# Patient Record
Sex: Female | Born: 1990
Health system: Southern US, Community
[De-identification: ages and names within clinical notes are randomized; demographics above are authoritative.]

## PROBLEM LIST (undated history)

## (undated) DIAGNOSIS — F419 Anxiety disorder, unspecified: Secondary | ICD-10-CM

## (undated) DIAGNOSIS — E079 Disorder of thyroid, unspecified: Secondary | ICD-10-CM

## (undated) HISTORY — DX: Disorder of thyroid, unspecified: E07.9

## (undated) HISTORY — DX: Anxiety disorder, unspecified: F41.9

---
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
## 2016-08-26 ENCOUNTER — Encounter: Payer: Self-pay | Admitting: Osteopathic Medicine

## 2016-08-26 ENCOUNTER — Ambulatory Visit (INDEPENDENT_AMBULATORY_CARE_PROVIDER_SITE_OTHER): Payer: 59 | Admitting: Osteopathic Medicine

## 2016-08-26 VITALS — BP 123/71 | HR 59 | Ht 64.0 in | Wt 152.0 lb

## 2016-08-26 DIAGNOSIS — E049 Nontoxic goiter, unspecified: Secondary | ICD-10-CM | POA: Diagnosis not present

## 2016-08-26 DIAGNOSIS — F339 Major depressive disorder, recurrent, unspecified: Secondary | ICD-10-CM | POA: Diagnosis not present

## 2016-08-26 DIAGNOSIS — E559 Vitamin D deficiency, unspecified: Secondary | ICD-10-CM

## 2016-08-26 DIAGNOSIS — F419 Anxiety disorder, unspecified: Secondary | ICD-10-CM

## 2016-08-26 DIAGNOSIS — B360 Pityriasis versicolor: Secondary | ICD-10-CM

## 2016-08-26 MED ORDER — ESCITALOPRAM OXALATE 10 MG PO TABS: 10.0000 mg | ORAL_TABLET | Freq: Every day | ORAL | 1 refills | Status: DC

## 2016-08-26 NOTE — Progress Notes (Signed)
HPI: Lindsey Powell is a 26 y.o. female  who presents to [facility] today, 08/26/16,  for chief complaint of:  Chief Complaint  Patient presents with  . Establish Care    Depression/anxiety: Patient thinks possibly ongoing for most of adolescence-adulthood. Never really addressed before. Never on medications. Patient states that family is not terribly supportive of mental health issues, no known family history that she is aware. Reports decreased interest/pleasure in daily activities, feels fairly motivated at work but in personal life has noticed more fatigued, irritable, mood is a suffering, relationships are becoming a problem for her. He has good support from her boyfriend, she enjoys her work. No history of bipolar type symptoms such as you for as lack of inhibition, risky sexual or spending habits. Family/friends have told her that she should seek medical attention and she finally feels able to do that. Would also like blood testing for thyroid or other possible causes of her symptoms.  Skin issue: Patchy skin on back and spreading onto torso for the past year or so. Recently seen by another physician about this but wasn't totally comfortable with their explanation, had some additional questions  Past medical, surgical, social and family history reviewed: There are no active problems to display for this patient.  No past surgical history on file. Social History  Substance Use Topics  . Smoking status: Not on file  . Smokeless tobacco: Not on file  . Alcohol use Not on file   No family history on file.   Current medication list and allergy/intolerance information reviewed:   No current outpatient prescriptions on file.   No current facility-administered medications for this visit.    Allergies not on file    Review of Systems:  Constitutional:  No  fever, no chills, No recent illness, No unintentional weight changes. No significant fatigue.    HEENT: No  headache, no vision change, no hearing change, No sore throat, No  sinus pressure  Cardiac: No  chest pain, No  pressure, No palpitations, No  Orthopnea  Respiratory:  No  shortness of breath. No  Cough  Gastrointestinal: No  abdominal pain, No  nausea, No  vomiting  Musculoskeletal: No new myalgia/arthralgia  Skin: No  Rash, No other wounds/concerning lesions  Neurologic: No  weakness, No  dizziness  Psychiatric: +concerns with depression, +concerns with anxiety, +sleep problems, No mood problems  Exam:  BP 123/71   Pulse (!) 59   Ht 5\' 4"  (1.626 m)   Wt 152 lb (68.9 kg)   BMI 26.09 kg/m   Constitutional: VS see above. General Appearance: alert, well-developed, well-nourished, NAD  Ears, Nose, Mouth, Throat: MMM, Normal external inspection ears/nares/mouth/lips/gums.   Respiratory: Normal respiratory effort. no wheeze, no rhonchi, no rales  Cardiovascular: S1/S2 normal, no murmur, no rub/gallop auscultated. RRR.   Gastrointestinal: Nontender, no masses. No hepatomegaly  Musculoskeletal: Gait normal. No clubbing/cyanosis of digits.   Neurological: Normal balance/coordination. No tremor.   Skin: warm, dry, intact. No rash/ulcer.  Psychiatric: Normal judgment/insight. Normal mood and affect. Oriented x3.    Depression screen [facility] 2/9 08/26/2016  Decreased Interest 3  Down, Depressed, Hopeless 3  PHQ - 2 Score 6  Altered sleeping 2  Tired, decreased energy 3  Change in appetite 3  Feeling bad or failure about yourself  2  Trouble concentrating 2  Moving slowly or fidgety/restless 0  Suicidal thoughts 0  PHQ-9 Score 18   GAD 7 : Generalized Anxiety Score 08/26/2016  Nervous, Anxious, on Edge 3  Control/stop worrying 3  Worry too much - different things 3  Trouble relaxing 3  Restless 1  Easily annoyed or irritable 3  Afraid - awful might happen 0  Total GAD 7 Score 16  Anxiety Difficulty Very difficult     ASSESSMENT/PLAN:   Anxiety -  Plan: CBC with Differential/Platelet, COMPLETE METABOLIC PANEL WITH GFR, TSH, VITAMIN D 25 Hydroxy (Vit-D Deficiency, Fractures), escitalopram (LEXAPRO) 10 MG tablet  Depression, recurrent (HCC) - Plan: CBC with Differential/Platelet, COMPLETE METABOLIC PANEL WITH GFR, TSH, VITAMIN D 25 Hydroxy (Vit-D Deficiency, Fractures), escitalopram (LEXAPRO) 10 MG tablet  Goiter - Plan: TSH  Tinea versicolor - Plan: CBC with Differential/Platelet, COMPLETE METABOLIC PANEL WITH GFR    Visit summary with medication list and pertinent instructions was printed for patient to review. All questions at time of visit were answered - patient instructed to contact office with any additional concerns. ER/RTC precautions were reviewed with the patient. Follow-up plan: Return in about 4 weeks (around 09/23/2016) for follow-up anxiety and depression .

## 2016-08-26 NOTE — Patient Instructions (Signed)
We are starting a medication today called Lexapro to help treat your anxiety and depression. This is a daily medication to help control your symptoms. I also highly encourage my patients who are suffering from anxiety and/or depression to seek care with a counselor or therapist. A therapist can coach you in techniques to recognize and deal with troubling thought patterns and behaviors. The ability to cope with external stressors is crucial to overall mental health.   Expect a call or message from me in the next 2 weeks: If doing well on the medicine but not feeling any effect, we can increase the dose. If you're starting to feel some effect/improvement, we can hold off on a dose increase and reevaluate at your office visit.   Let's plan to follow up in the office in 4 weeks. At that time, we can talk about how well the medicine is working for you, and we can consider increasing the dose, adding another medicine, etc.   If you experience problematic side effects, please let me know ASAP - we can switch the medicine any time, and we don't need an appointment for this.   Any questions or concerns, call me!

## 2016-08-27 LAB — COMPLETE METABOLIC PANEL WITH GFR
ALT: 8 U/L (ref 6–29)
AST: 17 U/L (ref 10–30)
Albumin: 4.3 g/dL (ref 3.6–5.1)
Alkaline Phosphatase: 54 U/L (ref 33–115)
BILIRUBIN TOTAL: 0.4 mg/dL (ref 0.2–1.2)
BUN: 14 mg/dL (ref 7–25)
CALCIUM: 9.5 mg/dL (ref 8.6–10.2)
CO2: 24 mmol/L (ref 20–31)
CREATININE: 0.83 mg/dL (ref 0.50–1.10)
Chloride: 105 mmol/L (ref 98–110)
GFR, Est Non African American: >89 mL/min (ref >=60)
Glucose, Bld: 80 mg/dL (ref 65–99)
Potassium: 4.2 mmol/L (ref 3.5–5.3)
Sodium: 139 mmol/L (ref 135–146)
TOTAL PROTEIN: 7.3 g/dL (ref 6.1–8.1)

## 2016-08-27 LAB — CBC WITH DIFFERENTIAL/PLATELET
BASOS PCT: 1 %
Basophils Absolute: 79 cells/uL (ref 0–200)
EOS ABS: 158 {cells}/uL (ref 15–500)
Eosinophils Relative: 2 %
HEMATOCRIT: 40.5 % (ref 35.0–45.0)
HEMOGLOBIN: 13.4 g/dL (ref 11.7–15.5)
LYMPHS PCT: 35 %
Lymphs Abs: 2765 cells/uL (ref 850–3900)
MCH: 29.5 pg (ref 27.0–33.0)
MCHC: 33.1 g/dL (ref 32.0–36.0)
MCV: 89 fL (ref 80.0–100.0)
MPV: 10.2 fL (ref 7.5–12.5)
Monocytes Absolute: 711 cells/uL (ref 200–950)
Monocytes Relative: 9 %
NEUTROS ABS: 4187 {cells}/uL (ref 1500–7800)
Neutrophils Relative %: 53 %
Platelets: 220 10*3/uL (ref 140–400)
RBC: 4.55 MIL/uL (ref 3.80–5.10)
RDW: 13.5 % (ref 11.0–15.0)
WBC: 7.9 10*3/uL (ref 3.8–10.8)

## 2016-08-27 LAB — TSH: TSH: 3.67 m[IU]/L

## 2016-08-27 LAB — VITAMIN D 25 HYDROXY (VIT D DEFICIENCY, FRACTURES): VIT D 25 HYDROXY: 19 ng/mL — AB (ref 30–100)

## 2016-08-28 DIAGNOSIS — E559 Vitamin D deficiency, unspecified: Secondary | ICD-10-CM | POA: Insufficient documentation

## 2016-08-28 MED ORDER — VITAMIN D (ERGOCALCIFEROL) 1.25 MG (50000 UNIT) PO CAPS: 50000.0000 [IU] | ORAL_CAPSULE | ORAL | 0 refills | Status: DC

## 2016-08-28 NOTE — Addendum Note (Signed)
Addended by: Deirdre PippinsALEXANDER, Lynden Carrithers M on: 08/28/2016 09:00 AM   Modules accepted: Orders

## 2016-09-15 ENCOUNTER — Telehealth: Payer: Self-pay | Admitting: Osteopathic Medicine

## 2016-09-15 NOTE — Telephone Encounter (Signed)
Called for 2-week f/u on lexapro - left voicemail to call back with any updates or concerns on our main clinic number

## 2017-04-22 DIAGNOSIS — E038 Other specified hypothyroidism: Secondary | ICD-10-CM | POA: Insufficient documentation

## 2018-06-23 DIAGNOSIS — Z833 Family history of diabetes mellitus: Secondary | ICD-10-CM | POA: Insufficient documentation

## 2018-06-23 DIAGNOSIS — Z6830 Body mass index (BMI) 30.0-30.9, adult: Secondary | ICD-10-CM | POA: Insufficient documentation

## 2018-06-23 HISTORY — DX: Family history of diabetes mellitus: Z83.3

## 2018-08-11 DIAGNOSIS — O24410 Gestational diabetes mellitus in pregnancy, diet controlled: Secondary | ICD-10-CM | POA: Insufficient documentation

## 2018-08-11 HISTORY — DX: Gestational diabetes mellitus in pregnancy, diet controlled: O24.410

## 2019-08-12 DIAGNOSIS — E049 Nontoxic goiter, unspecified: Secondary | ICD-10-CM | POA: Diagnosis not present

## 2019-08-12 DIAGNOSIS — G8929 Other chronic pain: Secondary | ICD-10-CM | POA: Diagnosis not present

## 2019-08-12 DIAGNOSIS — M533 Sacrococcygeal disorders, not elsewhere classified: Secondary | ICD-10-CM | POA: Diagnosis not present

## 2019-08-12 DIAGNOSIS — M5441 Lumbago with sciatica, right side: Secondary | ICD-10-CM | POA: Diagnosis not present

## 2019-08-12 DIAGNOSIS — E063 Autoimmune thyroiditis: Secondary | ICD-10-CM | POA: Diagnosis not present

## 2019-08-12 DIAGNOSIS — Z8632 Personal history of gestational diabetes: Secondary | ICD-10-CM | POA: Diagnosis not present

## 2019-08-12 DIAGNOSIS — E038 Other specified hypothyroidism: Secondary | ICD-10-CM | POA: Diagnosis not present

## 2019-08-15 DIAGNOSIS — Z8632 Personal history of gestational diabetes: Secondary | ICD-10-CM

## 2019-08-15 HISTORY — DX: Personal history of gestational diabetes: Z86.32

## 2020-02-11 DIAGNOSIS — Z20822 Contact with and (suspected) exposure to covid-19: Secondary | ICD-10-CM | POA: Diagnosis not present

## 2020-05-14 ENCOUNTER — Ambulatory Visit: Payer: 59 | Admitting: Medical-Surgical

## 2021-04-12 DIAGNOSIS — Z20822 Contact with and (suspected) exposure to covid-19: Secondary | ICD-10-CM | POA: Diagnosis not present

## 2021-04-12 DIAGNOSIS — M2550 Pain in unspecified joint: Secondary | ICD-10-CM | POA: Diagnosis not present

## 2021-04-12 DIAGNOSIS — M791 Myalgia, unspecified site: Secondary | ICD-10-CM | POA: Diagnosis not present

## 2021-05-07 ENCOUNTER — Encounter: Payer: Self-pay | Admitting: Medical-Surgical

## 2021-05-07 ENCOUNTER — Other Ambulatory Visit: Payer: Self-pay

## 2021-05-07 ENCOUNTER — Ambulatory Visit (INDEPENDENT_AMBULATORY_CARE_PROVIDER_SITE_OTHER): Payer: BC Managed Care – PPO | Admitting: Medical-Surgical

## 2021-05-07 VITALS — BP 111/76 | HR 68 | Resp 20 | Ht 64.0 in | Wt 183.4 lb

## 2021-05-07 DIAGNOSIS — Z0000 Encounter for general adult medical examination without abnormal findings: Secondary | ICD-10-CM

## 2021-05-07 DIAGNOSIS — Z8632 Personal history of gestational diabetes: Secondary | ICD-10-CM

## 2021-05-07 DIAGNOSIS — F419 Anxiety disorder, unspecified: Secondary | ICD-10-CM

## 2021-05-07 DIAGNOSIS — R946 Abnormal results of thyroid function studies: Secondary | ICD-10-CM

## 2021-05-07 DIAGNOSIS — Z131 Encounter for screening for diabetes mellitus: Secondary | ICD-10-CM

## 2021-05-07 DIAGNOSIS — Z7689 Persons encountering health services in other specified circumstances: Secondary | ICD-10-CM

## 2021-05-07 MED ORDER — SERTRALINE HCL 50 MG PO TABS: ORAL_TABLET | ORAL | 3 refills | Status: DC

## 2021-05-07 NOTE — Progress Notes (Signed)
New Patient Office Visit  Subjective:  Patient ID: Lindsey Powell, female    DOB: 04-Jul-1990  Age: 30 y.o. MRN: [id number]  CC:  Chief Complaint  Patient presents with   Establish Care     HPI Lindsey Powell presents to establish care.  She is a very pleasant 30 year old female who is the mother of 1 biological child and has custody of her nephew.  Has some concerns about her thyroid.  Around the age of 30 years old, she was told that her neck looked swollen.  She was having her thyroid checked every 6 months with no significant issues until this past March when they told her that her thyroid function was off.  They did refer her to endocrinology but she never made it to this specialist appointment.  She would like to have her thyroid function checked again today.  She does have concerns about being prediabetic.  She had gestational diabetes during her pregnancy and at her last check, she was told that she was on the verge of being prediabetic.  Has had some significant weight fluctuations over the past couple of years.  Has had some significant anxiety which has been a problem for a long time.  She was previously prescribed Lexapro but did not take the medication as she was worried about side effects.  She did try Cymbalta at one point and notes that it almost wrecked her life.  She has been scared of medications and how they may affect her.  Unfortunately, her anxiety is starting to affect her marriage and the mood swings have gotten out of hand.  She is ready to start a medication at this point.  Denies SI/HI.  Past Medical History:  Diagnosis Date   Anxiety    Thyroid disease     Past Surgical History:  Procedure Laterality Date   ARTHROSCOPIC REPAIR ACL  2010   CESAREAN SECTION      Family History  Problem Relation Age of Onset   Diabetes Mother    Alcohol abuse Mother    Diabetes Father    Alcohol abuse Father    Cancer Father    Hypertension Father    Hypertension  Maternal Aunt    Alcohol abuse Paternal Uncle    Cancer Maternal Grandfather    Cancer Paternal Grandfather     Social History   Socioeconomic History   Marital status: Married    Spouse name: Not on file   Number of children: Not on file   Years of education: Not on file   Highest education level: Not on file  Occupational History   Not on file  Tobacco Use   Smoking status: Never   Smokeless tobacco: Never  Vaping Use   Vaping Use: Never used  Substance and Sexual Activity   Alcohol use: Yes    Comment: once a month   Drug use: Never   Sexual activity: Yes    Birth control/protection: None  Other Topics Concern   Not on file  Social History Narrative   Not on file   Social Determinants of Health   Financial Resource Strain: Not on file  Food Insecurity: Not on file  Transportation Needs: Not on file  Physical Activity: Not on file  Stress: Not on file  Social Connections: Not on file  Intimate Partner Violence: Not on file    ROS Review of Systems  Constitutional:  Positive for unexpected weight change. Negative for chills, fatigue and fever.  HENT:  Negative for congestion, rhinorrhea, sinus pressure and sore throat.   Respiratory:  Negative for cough, chest tightness and shortness of breath.   Cardiovascular:  Negative for chest pain, palpitations and leg swelling.  Gastrointestinal:  Negative for abdominal pain, constipation, diarrhea, nausea and vomiting.  Endocrine: Negative for cold intolerance and heat intolerance.  Genitourinary:  Negative for dysuria, frequency, urgency, vaginal bleeding and vaginal discharge.  Skin:  Negative for rash and wound.  Neurological:  Negative for dizziness, light-headedness and headaches.  Hematological:  Does not bruise/bleed easily.  Psychiatric/Behavioral:  Negative for dysphoric mood, self-injury, sleep disturbance and suicidal ideas. The patient is nervous/anxious.    Objective:   Today's Vitals: BP 111/76 (BP  Location: Right Arm, Patient Position: Sitting, Cuff Size: Large)   Pulse 68   Resp 20   Ht 5\' 4"  (1.626 m)   Wt 183 lb 6.4 oz (83.2 kg)   LMP  (LMP Unknown)   SpO2 99%   BMI 31.48 kg/m   Physical Exam Vitals reviewed.  Constitutional:      General: She is not in acute distress.    Appearance: Normal appearance.  HENT:     Head: Normocephalic and atraumatic.  Cardiovascular:     Rate and Rhythm: Normal rate and regular rhythm.     Pulses: Normal pulses.     Heart sounds: Normal heart sounds. No murmur heard.   No friction rub. No gallop.  Pulmonary:     Effort: Pulmonary effort is normal. No respiratory distress.     Breath sounds: Normal breath sounds. No wheezing.  Skin:    General: Skin is warm and dry.  Neurological:     Mental Status: She is alert and oriented to person, place, and time.  Psychiatric:        Mood and Affect: Mood normal.        Behavior: Behavior normal.        Thought Content: Thought content normal.        Judgment: Judgment normal.    Assessment & Plan:   1. Encounter to establish care Reviewed available information and discussed care concerns with patient.   2. Preventative health care Checking labs. - CBC with Differential/Platelet - COMPLETE METABOLIC PANEL WITH GFR - Lipid panel  3. History of gestational diabetes 4. Diabetes mellitus screening Checking hemoglobin A1c. - Hemoglobin A1c  5. Abnormal thyroid function test Checking full thyroid panel with thyroid antibodies. - Thyroid Panel With TSH - Thyroid peroxidase antibody  6. Anxiety Discussed various options.  Starting sertraline 25 mg daily for the first 8 days then increase to 50 mg daily.  Reviewed timeline expectations for effectiveness of medication.  Discussed counseling.  She is hesitant to start this at this point so she will let me know if she changes her mind.  Outpatient Encounter Medications as of 05/07/2021  Medication Sig   Multiple Vitamin (MULTIVITAMIN)  tablet Take 1 tablet by mouth daily.   sertraline (ZOLOFT) 50 MG tablet Take 1/2 tablet daily for 8 days then increase to 1 full tablet.   [DISCONTINUED] escitalopram (LEXAPRO) 10 MG tablet Take 1 tablet (10 mg total) by mouth daily.   [DISCONTINUED] Vitamin D, Ergocalciferol, (DRISDOL) 50000 units CAPS capsule Take 1 capsule (50,000 Units total) by mouth every 7 (seven) days. Take for 8 total doses(weeks)   No facility-administered encounter medications on file as of 05/07/2021.    Follow-up: Return in about 4 weeks (around 06/04/2021) for mood follow up.   08/02/2021,  DNP, APRN, FNP-BC [facility]

## 2021-05-08 ENCOUNTER — Encounter: Payer: Self-pay | Admitting: Medical-Surgical

## 2021-05-08 LAB — LIPID PANEL
Cholesterol: 180 mg/dL (ref <200)
HDL: 31 mg/dL — ABNORMAL LOW (ref >=50)
LDL Cholesterol (Calc): 119 mg/dL (calc) — ABNORMAL HIGH
Non-HDL Cholesterol (Calc): 149 mg/dL (calc) — ABNORMAL HIGH (ref <130)
Total CHOL/HDL Ratio: 5.8 (calc) — ABNORMAL HIGH (ref <5.0)
Triglycerides: 178 mg/dL — ABNORMAL HIGH (ref <150)

## 2021-05-08 LAB — CBC WITH DIFFERENTIAL/PLATELET
Absolute Monocytes: 702 cells/uL (ref 200–950)
Basophils Absolute: 47 cells/uL (ref 0–200)
Basophils Relative: 0.6 %
Eosinophils Absolute: 140 cells/uL (ref 15–500)
Eosinophils Relative: 1.8 %
HCT: 37.3 % (ref 35.0–45.0)
Hemoglobin: 12.4 g/dL (ref 11.7–15.5)
Lymphs Abs: 2551 cells/uL (ref 850–3900)
MCH: 28.6 pg (ref 27.0–33.0)
MCHC: 33.2 g/dL (ref 32.0–36.0)
MCV: 85.9 fL (ref 80.0–100.0)
MPV: 10.9 fL (ref 7.5–12.5)
Monocytes Relative: 9 %
Neutro Abs: 4360 cells/uL (ref 1500–7800)
Neutrophils Relative %: 55.9 %
Platelets: 212 10*3/uL (ref 140–400)
RBC: 4.34 10*6/uL (ref 3.80–5.10)
RDW: 12.8 % (ref 11.0–15.0)
Total Lymphocyte: 32.7 %
WBC: 7.8 10*3/uL (ref 3.8–10.8)

## 2021-05-08 LAB — COMPLETE METABOLIC PANEL WITH GFR
AG Ratio: 1.7 (calc) (ref 1.0–2.5)
ALT: 8 U/L (ref 6–29)
AST: 15 U/L (ref 10–30)
Albumin: 4.5 g/dL (ref 3.6–5.1)
Alkaline phosphatase (APISO): 66 U/L (ref 31–125)
BUN: 11 mg/dL (ref 7–25)
CO2: 26 mmol/L (ref 20–32)
Calcium: 9.4 mg/dL (ref 8.6–10.2)
Chloride: 106 mmol/L (ref 98–110)
Creat: 0.81 mg/dL (ref 0.50–0.96)
Globulin: 2.7 g/dL (calc) (ref 1.9–3.7)
Glucose, Bld: 80 mg/dL (ref 65–99)
Potassium: 4.3 mmol/L (ref 3.5–5.3)
Sodium: 140 mmol/L (ref 135–146)
Total Bilirubin: 0.4 mg/dL (ref 0.2–1.2)
Total Protein: 7.2 g/dL (ref 6.1–8.1)
eGFR: 101 mL/min/{1.73_m2} (ref >=60)

## 2021-05-08 LAB — THYROID PANEL WITH TSH
Free Thyroxine Index: 2.1 (ref 1.4–3.8)
T3 Uptake: 28 % (ref 22–35)
T4, Total: 7.4 ug/dL (ref 5.1–11.9)
TSH: 4.48 mIU/L

## 2021-05-08 LAB — HEMOGLOBIN A1C
Hgb A1c MFr Bld: 6.3 % of total Hgb — ABNORMAL HIGH (ref <5.7)
Mean Plasma Glucose: 134 mg/dL
eAG (mmol/L): 7.4 mmol/L

## 2021-05-08 LAB — THYROID PEROXIDASE ANTIBODY: Thyroperoxidase Ab SerPl-aCnc: 178 IU/mL — ABNORMAL HIGH (ref <9)

## 2021-05-09 MED ORDER — METFORMIN HCL ER 500 MG PO TB24
500.0000 mg | ORAL_TABLET | Freq: Every day | ORAL | 0 refills | Status: DC
Start: 2021-05-09 — End: 2021-08-05

## 2021-05-09 NOTE — Telephone Encounter (Signed)
Please have her schedule follow-up with Joy in about 8 weeks to go over diet and exercise recommendations to reduce her A1c and to also see how she is doing on her metformin.  Meds ordered this encounter  Medications   metFORMIN (GLUCOPHAGE-XR) 500 MG 24 hr tablet    Sig: Take 1 tablet (500 mg total) by mouth daily with breakfast.    Dispense:  90 tablet    Refill:  0

## 2021-05-29 ENCOUNTER — Other Ambulatory Visit: Payer: Self-pay | Admitting: Medical-Surgical

## 2021-06-11 ENCOUNTER — Telehealth (INDEPENDENT_AMBULATORY_CARE_PROVIDER_SITE_OTHER): Payer: BC Managed Care – PPO | Admitting: Medical-Surgical

## 2021-06-11 ENCOUNTER — Encounter: Payer: Self-pay | Admitting: Medical-Surgical

## 2021-06-11 DIAGNOSIS — F339 Major depressive disorder, recurrent, unspecified: Secondary | ICD-10-CM | POA: Diagnosis not present

## 2021-06-11 DIAGNOSIS — F419 Anxiety disorder, unspecified: Secondary | ICD-10-CM

## 2021-06-11 MED ORDER — SERTRALINE HCL 25 MG PO TABS: 25.0000 mg | ORAL_TABLET | Freq: Every day | ORAL | 1 refills | Status: DC

## 2021-06-11 NOTE — Progress Notes (Signed)
Virtual Visit via Video Note  I connected with Lindsey Powell on 06/11/21 at  2:00 PM EST by a video enabled telemedicine application and verified that I am speaking with the correct person using two identifiers.   I discussed the limitations of evaluation and management by telemedicine and the availability of in person appointments. The patient expressed understanding and agreed to proceed.  Patient location: Personal vehicle Provider locations: office  Subjective:    CC: Mood follow-up  HPI: Pleasant 31 year old female presenting via [place] video visit for mood follow-up.  She has been taking sertraline as instructed for the past 4 weeks.  She started at 25 mg daily for 1 week and then increase to 50 mg daily as prescribed.  She notes the first week she had severe dry mouth as well as regular headaches.  This got better after the first 7 days.  She did well for the next week or so but then her she became significantly exhausted, requiring naps every day.  She has had no appetite and she notes her brain feels somewhat fuzzy.  She did start metformin at the same time and has been taking this as prescribed without side effects.  Wonders if the metformin may be contributing or if this is all side effects from Zoloft.  Denies SI/HI.  Past medical history, Surgical history, Family history not pertinant except as noted below, Social history, Allergies, and medications have been entered into the medical record, reviewed, and corrections made.   Review of Systems: See HPI for pertinent positives and negatives.   Depression screen [facility] 2/9 06/11/2021 05/07/2021 08/26/2016  Decreased Interest 3 1 3   Down, Depressed, Hopeless 1 0 3  PHQ - 2 Score 4 1 6   Altered sleeping 3 0 2  Tired, decreased energy 3 2 3   Change in appetite 3 2 3   Feeling bad or failure about yourself  0 1 2  Trouble concentrating 3 2 2   Moving slowly or fidgety/restless 0 1 0  Suicidal thoughts 0 0 0  PHQ-9 Score 16 9 18    Difficult doing work/chores Very difficult Very difficult -   GAD 7 : Generalized Anxiety Score 06/11/2021 05/07/2021 08/26/2016  Nervous, Anxious, on Edge 2 3 3   Control/stop worrying 2 2 3   Worry too much - different things 2 2 3   Trouble relaxing 2 3 3   Restless 0 2 1  Easily annoyed or irritable 2 3 3   Afraid - awful might happen 0 2 0  Total GAD 7 Score 10 17 16   Anxiety Difficulty Very difficult Extremely difficult Very difficult   Objective:    General: Speaking clearly in complete sentences without any shortness of breath.  Alert and oriented x3.  Normal judgment. No apparent acute distress.  Impression and Recommendations:    1. Anxiety 2. Depression, recurrent ([place]) On our call, she was alert and oriented, clear thought processes and normal speech patterns. Suspect she may be a bit overmedicated.  Discussed continuing with Zoloft, changing the dose, or switching to an alternative agent.  She did very well on the first week or so after her initial side effects were off so we will reduce sertraline to 25 mg daily.  Plan to follow-up virtually in about 4 weeks to see if this is a better regimen for her.  I discussed the assessment and treatment plan with the patient. The patient was provided an opportunity to ask questions and all were answered. The patient agreed with the plan and demonstrated an  understanding of the instructions.   The patient was advised to call back or seek an in-person evaluation if the symptoms worsen or if the condition fails to improve as anticipated.  20 minutes of non-face-to-face time was provided during this encounter.  Return in about 4 weeks (around 07/09/2021) for mood follow up (virtual is fine).  Clearnce Sorrel, DNP, APRN, FNP-BC [facility]

## 2021-08-04 ENCOUNTER — Other Ambulatory Visit: Payer: Self-pay | Admitting: Family Medicine

## 2021-08-29 ENCOUNTER — Other Ambulatory Visit: Payer: Self-pay | Admitting: Family Medicine

## 2021-09-19 ENCOUNTER — Other Ambulatory Visit: Payer: Self-pay | Admitting: Family Medicine

## 2021-09-20 MED ORDER — METFORMIN HCL ER 500 MG PO TB24: 500.0000 mg | ORAL_TABLET | Freq: Every day | ORAL | 0 refills | Status: DC

## 2021-09-25 ENCOUNTER — Other Ambulatory Visit: Payer: Self-pay | Admitting: Medical-Surgical

## 2021-09-26 ENCOUNTER — Encounter: Payer: Self-pay | Admitting: Medical-Surgical

## 2021-09-26 NOTE — Addendum Note (Signed)
Addended byAnnamaria Helling on: 09/26/2021 02:45 PM ? ? Modules accepted: Orders ? ?

## 2021-09-26 NOTE — Telephone Encounter (Signed)
Patient needs 90 day supplies, has appt scheduled next week. Okay to send?  ?

## 2021-10-02 ENCOUNTER — Ambulatory Visit (INDEPENDENT_AMBULATORY_CARE_PROVIDER_SITE_OTHER): Payer: BC Managed Care – PPO | Admitting: Medical-Surgical

## 2021-10-02 ENCOUNTER — Encounter: Payer: Self-pay | Admitting: Medical-Surgical

## 2021-10-02 VITALS — BP 104/70 | HR 71 | Resp 20 | Ht 64.0 in | Wt 174.8 lb

## 2021-10-02 DIAGNOSIS — R5383 Other fatigue: Secondary | ICD-10-CM

## 2021-10-02 DIAGNOSIS — R7303 Prediabetes: Secondary | ICD-10-CM | POA: Diagnosis not present

## 2021-10-02 DIAGNOSIS — F339 Major depressive disorder, recurrent, unspecified: Secondary | ICD-10-CM | POA: Diagnosis not present

## 2021-10-02 DIAGNOSIS — R946 Abnormal results of thyroid function studies: Secondary | ICD-10-CM

## 2021-10-02 DIAGNOSIS — F419 Anxiety disorder, unspecified: Secondary | ICD-10-CM

## 2021-10-02 MED ORDER — METFORMIN HCL ER 500 MG PO TB24
500.0000 mg | ORAL_TABLET | Freq: Every day | ORAL | 3 refills | Status: DC
Start: 2021-10-02 — End: 2021-10-10

## 2021-10-02 NOTE — Progress Notes (Signed)
?  HPI with pertinent ROS:  ? ?CC: fatigue/exhaustion since starting Zoloft and Metformin ? ?HPI: ?Pleasant 31 year old female presenting today with complaints of significant fatigue and exhaustion since starting Zoloft and metformin 4-5 months ago.  She was originally prescribed to take Zoloft 25 mg daily for the first week and then increase to 50 mg daily.  At her follow-up, she noted the significant exhaustion had become a daily issue and we decreased her dose back to 25 mg daily.  She continued with metformin at the prescribed dose.  Today, she notes that she has been very tired and has to keep herself very busy or sh will fall asleep sitting up. Worse over the last couple of month but has improved some in the last 2-3 weeks. Sleeping well at night with an average of 7 hours. No grogginess or headaches in the morning. Has not been told that she snores. No fevers, chills, skin changes, nail changes, hair loss, or heat/cold intolerance. Positive for chronic constipation.  ? ?Mood- taking Zoloft 25mg  daily. Unsure if fatigue is related to the medication or if it is due to something else.  Notes that she is less anxious than she used to be overall so the medication may be working some however she is not sure how effective it is. ? ?I reviewed the past medical history, family history, social history, surgical history, and allergies today and no changes were needed.  Please see the problem list section below in epic for further details. ? ?Physical exam:  ? ?General: Well Developed, well nourished, and in no acute distress.  ?Neuro: Alert and oriented x3.  ?HEENT: Normocephalic, atraumatic.  ?Skin: Warm and dry. ?Cardiac: Regular rate and rhythm, no murmurs rubs or gallops, no lower extremity edema.  ?Respiratory: Clear to auscultation bilaterally. Not using accessory muscles, speaking in full sentences. ? ?Impression and Recommendations:   ? ?1. Anxiety ?2. Depression, recurrent (HCC) ?Discussed her current treatment  and options for management of mood concerns.  Reviewed switching to a new agent, adjusting the Zoloft dose itself, or adding Wellbutrin. ? ?3. Prediabetes ?Checking hemoglobin A1c today.  Continue metformin 500 mg daily as prescribed. ? ?4. Abnormal thyroid function test ?Rechecking thyroid function and thyroid antibodies. ? ?5. Fatigue, unspecified type ?Adding CBC with differential and CMP to blood work today.  Suspect this is medication mediated however would like to rule out anemia or electrolyte imbalances. ? ?Return for Follow-up pending lab results and associated plan. ?___________________________________________ ? , DNP, APRN, FNP-BC ?Primary Care and Sports Medicine ?[facility] ?

## 2021-10-03 LAB — CBC WITH DIFFERENTIAL/PLATELET
Absolute Monocytes: 661 cells/uL (ref 200–950)
Basophils Absolute: 53 cells/uL (ref 0–200)
Basophils Relative: 0.7 %
Eosinophils Absolute: 167 cells/uL (ref 15–500)
Eosinophils Relative: 2.2 %
HCT: 37.7 % (ref 35.0–45.0)
Hemoglobin: 12.4 g/dL (ref 11.7–15.5)
Lymphs Abs: 2683 cells/uL (ref 850–3900)
MCH: 28.3 pg (ref 27.0–33.0)
MCHC: 32.9 g/dL (ref 32.0–36.0)
MCV: 86.1 fL (ref 80.0–100.0)
MPV: 10.5 fL (ref 7.5–12.5)
Monocytes Relative: 8.7 %
Neutro Abs: 4036 cells/uL (ref 1500–7800)
Neutrophils Relative %: 53.1 %
Platelets: 266 10*3/uL (ref 140–400)
RBC: 4.38 10*6/uL (ref 3.80–5.10)
RDW: 14.4 % (ref 11.0–15.0)
Total Lymphocyte: 35.3 %
WBC: 7.6 10*3/uL (ref 3.8–10.8)

## 2021-10-03 LAB — COMPLETE METABOLIC PANEL WITH GFR
AG Ratio: 1.6 (calc) (ref 1.0–2.5)
ALT: 12 U/L (ref 6–29)
AST: 22 U/L (ref 10–30)
Albumin: 4.5 g/dL (ref 3.6–5.1)
Alkaline phosphatase (APISO): 62 U/L (ref 31–125)
BUN: 16 mg/dL (ref 7–25)
CO2: 23 mmol/L (ref 20–32)
Calcium: 9.5 mg/dL (ref 8.6–10.2)
Chloride: 108 mmol/L (ref 98–110)
Creat: 0.92 mg/dL (ref 0.50–0.96)
Globulin: 2.8 g/dL (calc) (ref 1.9–3.7)
Glucose, Bld: 76 mg/dL (ref 65–99)
Potassium: 4.2 mmol/L (ref 3.5–5.3)
Sodium: 138 mmol/L (ref 135–146)
Total Bilirubin: 0.5 mg/dL (ref 0.2–1.2)
Total Protein: 7.3 g/dL (ref 6.1–8.1)
eGFR: 86 mL/min/{1.73_m2} (ref >=60)

## 2021-10-03 LAB — HEMOGLOBIN A1C
Hgb A1c MFr Bld: 5.6 % of total Hgb (ref <5.7)
Mean Plasma Glucose: 114 mg/dL
eAG (mmol/L): 6.3 mmol/L

## 2021-10-03 LAB — IRON,TIBC AND FERRITIN PANEL
%SAT: 7 % (calc) — ABNORMAL LOW (ref 16–45)
Ferritin: 3 ng/mL — ABNORMAL LOW (ref 16–154)
Iron: 28 ug/dL — ABNORMAL LOW (ref 40–190)
TIBC: 376 mcg/dL (calc) (ref 250–450)

## 2021-10-03 LAB — THYROID PEROXIDASE ANTIBODY: Thyroperoxidase Ab SerPl-aCnc: 110 IU/mL — ABNORMAL HIGH (ref <9)

## 2021-10-03 LAB — VITAMIN B12: Vitamin B-12: 430 pg/mL (ref 200–1100)

## 2021-10-03 LAB — TSH+FREE T4: TSH W/REFLEX TO FT4: 3.3 mIU/L

## 2021-10-08 ENCOUNTER — Ambulatory Visit (INDEPENDENT_AMBULATORY_CARE_PROVIDER_SITE_OTHER): Payer: BC Managed Care – PPO

## 2021-10-08 DIAGNOSIS — R946 Abnormal results of thyroid function studies: Secondary | ICD-10-CM

## 2021-10-08 DIAGNOSIS — E049 Nontoxic goiter, unspecified: Secondary | ICD-10-CM | POA: Diagnosis not present

## 2021-10-09 ENCOUNTER — Encounter: Payer: Self-pay | Admitting: Medical-Surgical

## 2021-10-09 DIAGNOSIS — E042 Nontoxic multinodular goiter: Secondary | ICD-10-CM

## 2021-10-09 DIAGNOSIS — R946 Abnormal results of thyroid function studies: Secondary | ICD-10-CM

## 2021-10-10 MED ORDER — METFORMIN HCL ER 500 MG PO TB24: 1000.0000 mg | ORAL_TABLET | Freq: Every day | ORAL | 3 refills | Status: DC

## 2021-10-10 MED ORDER — BUPROPION HCL ER (XL) 150 MG PO TB24: 150.0000 mg | ORAL_TABLET | ORAL | 0 refills | Status: DC

## 2021-10-11 DIAGNOSIS — H18212 Corneal edema secondary to contact lens, left eye: Secondary | ICD-10-CM | POA: Diagnosis not present

## 2021-11-23 ENCOUNTER — Encounter: Payer: Self-pay | Admitting: Medical-Surgical

## 2021-11-25 MED ORDER — METFORMIN HCL ER 500 MG PO TB24
1000.0000 mg | ORAL_TABLET | Freq: Every day | ORAL | 3 refills | Status: DC
Start: 2021-11-25 — End: 2022-12-05

## 2021-12-09 ENCOUNTER — Other Ambulatory Visit (HOSPITAL_BASED_OUTPATIENT_CLINIC_OR_DEPARTMENT_OTHER): Payer: Self-pay | Admitting: Medical-Surgical

## 2021-12-09 DIAGNOSIS — Z1231 Encounter for screening mammogram for malignant neoplasm of breast: Secondary | ICD-10-CM

## 2021-12-11 DIAGNOSIS — Z1231 Encounter for screening mammogram for malignant neoplasm of breast: Secondary | ICD-10-CM

## 2021-12-12 ENCOUNTER — Other Ambulatory Visit: Payer: Self-pay | Admitting: Medical-Surgical

## 2021-12-12 NOTE — Telephone Encounter (Signed)
Needs appointment for follow up since starting Wellbutrin and increasing Metformin.

## 2021-12-13 NOTE — Telephone Encounter (Signed)
Please contact the patient to schedule an appointment for medication follow up. Per Ander Slade - Needs appointment for follow up since starting Wellbutrin and increasing Metformin. Thanks in advance.

## 2021-12-13 NOTE — Telephone Encounter (Signed)
LVM for patient to call back to get f/u visit scheduled with PCP. AMUCK

## 2021-12-26 ENCOUNTER — Other Ambulatory Visit: Payer: Self-pay | Admitting: Medical-Surgical

## 2021-12-30 ENCOUNTER — Other Ambulatory Visit: Payer: Self-pay | Admitting: Medical-Surgical

## 2022-01-06 ENCOUNTER — Other Ambulatory Visit: Payer: Self-pay | Admitting: Medical-Surgical

## 2022-02-02 ENCOUNTER — Other Ambulatory Visit: Payer: Self-pay | Admitting: Medical-Surgical

## 2022-04-28 ENCOUNTER — Telehealth: Payer: Self-pay | Admitting: Medical-Surgical

## 2022-04-28 NOTE — Telephone Encounter (Signed)
Patient needs an appointment for further refills on Bupropion (Wellbutrin) .  Last office visit 10/02/2021  Last filled 12/31/2021  Sent 30 day supply to the pharmacy. No Refills.

## 2022-04-28 NOTE — Telephone Encounter (Signed)
Called patient and LVM to call office to schedule appointment for medication refills.

## 2022-05-09 ENCOUNTER — Ambulatory Visit: Payer: BC Managed Care – PPO | Admitting: Medical-Surgical

## 2022-05-15 ENCOUNTER — Ambulatory Visit (INDEPENDENT_AMBULATORY_CARE_PROVIDER_SITE_OTHER): Payer: BC Managed Care – PPO | Admitting: Medical-Surgical

## 2022-05-15 DIAGNOSIS — Z91199 Patient's noncompliance with other medical treatment and regimen due to unspecified reason: Secondary | ICD-10-CM

## 2022-05-16 NOTE — Progress Notes (Signed)
   Complete physical exam  Patient: Lindsey Powell   DOB: 03/22/1997   31 y.o. Female  MRN: [id number]  Subjective:    No chief complaint on file.   Lindsey Powell is a 31 y.o. female who presents today for a complete physical exam. She reports consuming a {diet types:17450} diet. {types:19826} She generally feels {DESC; WELL/FAIRLY WELL/POORLY:18703}. She reports sleeping {DESC; WELL/FAIRLY WELL/POORLY:18703}. She {does/does not:200015} have additional problems to discuss today.    Most recent fall risk assessment:    11/27/2021   10:42 AM  Fall Risk   Falls in the past year? 0  Number falls in past yr: 0  Injury with Fall? 0  Risk for fall due to : No Fall Risks  Follow up Falls evaluation completed     Most recent depression screenings:    11/27/2021   10:42 AM 10/18/2020   10:46 AM  PHQ 2/9 Scores  PHQ - 2 Score 0 0  PHQ- 9 Score 5     {VISON DENTAL STD PSA (Optional):27386}  {History (Optional):23778}  Patient Care Team: Detavious Rinn, NP as PCP - General (Nurse Practitioner)   Outpatient Medications Prior to Visit  Medication Sig   fluticasone (FLONASE) 50 MCG/ACT nasal spray Place 2 sprays into both nostrils in the morning and at bedtime. After 7 days, reduce to once daily.   norgestimate-ethinyl estradiol (SPRINTEC 28) 0.25-35 MG-MCG tablet Take 1 tablet by mouth daily.   Nystatin POWD Apply liberally to affected area 2 times per day   spironolactone (ALDACTONE) 100 MG tablet Take 1 tablet (100 mg total) by mouth daily.   No facility-administered medications prior to visit.    ROS        Objective:     There were no vitals taken for this visit. {Vitals History (Optional):23777}  Physical Exam   No results found for any visits on 01/02/22. {Show previous labs (optional):23779}    Assessment & Plan:    Routine Health Maintenance and Physical Exam  Immunization History  Administered Date(s) Administered   DTaP 06/05/1999, 08/01/1999,  10/10/1999, 06/25/2000, 01/09/2004   Hepatitis A 11/05/2007, 11/10/2008   Hepatitis B 03/23/1999, 04/30/1999, 10/10/1999   HiB (PRP-OMP) 06/05/1999, 08/01/1999, 10/10/1999, 06/25/2000   IPV 06/05/1999, 08/01/1999, 03/30/2000, 01/09/2004   Influenza,inj,Quad PF,6+ Mos 02/10/2014   Influenza-Unspecified 05/12/2012   MMR 03/30/2001, 01/09/2004   Meningococcal Polysaccharide 11/10/2011   Pneumococcal Conjugate-13 06/25/2000   Pneumococcal-Unspecified 10/10/1999, 12/24/1999   Tdap 11/10/2011   Varicella 03/30/2000, 11/05/2007    Health Maintenance  Topic Date Due   HIV Screening  Never done   Hepatitis C Screening  Never done   INFLUENZA VACCINE  12/31/2021   PAP-Cervical Cytology Screening  01/02/2022 (Originally 03/21/2020)   PAP SMEAR-Modifier  01/02/2022 (Originally 03/21/2020)   TETANUS/TDAP  01/02/2022 (Originally 11/09/2021)   HPV VACCINES  Discontinued   COVID-19 Vaccine  Discontinued    Discussed health benefits of physical activity, and encouraged her to engage in regular exercise appropriate for her age and condition.  Problem List Items Addressed This Visit   None Visit Diagnoses     Annual physical exam    -  Primary   Cervical cancer screening       Need for Tdap vaccination          No follow-ups on file.     Shela Esses, NP   

## 2022-05-26 ENCOUNTER — Other Ambulatory Visit: Payer: Self-pay | Admitting: Medical-Surgical

## 2022-05-30 ENCOUNTER — Other Ambulatory Visit: Payer: Self-pay | Admitting: Medical-Surgical

## 2022-06-13 ENCOUNTER — Encounter: Payer: Self-pay | Admitting: Medical-Surgical

## 2022-06-13 ENCOUNTER — Ambulatory Visit (INDEPENDENT_AMBULATORY_CARE_PROVIDER_SITE_OTHER): Payer: BC Managed Care – PPO | Admitting: Medical-Surgical

## 2022-06-13 VITALS — BP 107/68 | HR 79 | Resp 20 | Ht 64.0 in | Wt 157.8 lb

## 2022-06-13 DIAGNOSIS — F339 Major depressive disorder, recurrent, unspecified: Secondary | ICD-10-CM | POA: Diagnosis not present

## 2022-06-13 DIAGNOSIS — R7303 Prediabetes: Secondary | ICD-10-CM | POA: Diagnosis not present

## 2022-06-13 DIAGNOSIS — E559 Vitamin D deficiency, unspecified: Secondary | ICD-10-CM

## 2022-06-13 DIAGNOSIS — E063 Autoimmune thyroiditis: Secondary | ICD-10-CM

## 2022-06-13 DIAGNOSIS — F419 Anxiety disorder, unspecified: Secondary | ICD-10-CM

## 2022-06-13 DIAGNOSIS — E038 Other specified hypothyroidism: Secondary | ICD-10-CM

## 2022-06-13 DIAGNOSIS — Z0000 Encounter for general adult medical examination without abnormal findings: Secondary | ICD-10-CM

## 2022-06-13 MED ORDER — SERTRALINE HCL 25 MG PO TABS: 25.0000 mg | ORAL_TABLET | Freq: Every day | ORAL | 1 refills | Status: DC

## 2022-06-13 MED ORDER — BUPROPION HCL ER (XL) 300 MG PO TB24: 300.0000 mg | ORAL_TABLET | Freq: Every day | ORAL | 1 refills | Status: DC

## 2022-06-13 MED ORDER — MELOXICAM 15 MG PO TABS: 15.0000 mg | ORAL_TABLET | Freq: Every day | ORAL | 0 refills | Status: DC

## 2022-06-13 NOTE — Progress Notes (Signed)
Established Patient Office Visit  Subjective   Patient ID: Janine Reller, female   DOB: 06-10-1990 Age: 32 y.o. MRN: [id number]   Chief Complaint  Patient presents with   Follow-up   Medication Refill   HPI Pleasant 32 year old female presenting today for the following:  Mood: Taking sertraline 50 mg daily and Wellbutrin 150 mg daily, tolerating well without side effects.  Feels that the medication is working well for her and that she is in a much better mental place than she was when she started with our care last year.  Does still find herself getting irritable or annoyed quickly but otherwise no concerns with mood swings.  Denies SI/HI.  Has been having issues with left heel pain for weeks to months.  She has done multiple conservative measures at home to help treat this but so far nothing is working.  There are times when even standing up to walk is extremely painful.  Today she is wearing a small brace on the arch of her left foot which seems to help some with the symptoms.  Occasionally uses NSAIDs but not regularly.  Has several moles on her body that her doctors tried to remove when she was young.  Her family members did not proceed with this.  There are a couple that are larger but are on the back of her neck and shoulder and she cannot see them.  She has several friends who have been having skin lesions removed and would like to have them evaluated today to see if there is any intervention needed.  She does have a history of Hashimoto's thyroiditis and has had an updated thyroid ultrasound showing multiple nodules present.  Feels that the goiter is slightly smaller since she has made some lifestyle changes over the last year.  Not currently taking any medications for treatment.  She does have a history of prediabetes and had gestational diabetes during her pregnancy.  She is currently taking metformin 1000 mg once daily, tolerating well without side effects.  Exercising regularly and  working on dietary modifications.   Objective:    Vitals:   06/13/22 1322  BP: 107/68  Pulse: 79  Resp: 20  Height: 5\' 4"  (1.626 m)  Weight: 157 lb 12.8 oz (71.6 kg)  SpO2: 99%  BMI (Calculated): 27.07   Physical Exam Vitals and nursing note reviewed.  Constitutional:      General: She is not in acute distress.    Appearance: Normal appearance. She is not ill-appearing.  HENT:     Head: Normocephalic and atraumatic.  Cardiovascular:     Rate and Rhythm: Normal rate and regular rhythm.     Pulses: Normal pulses.     Heart sounds: Normal heart sounds.  Pulmonary:     Effort: Pulmonary effort is normal. No respiratory distress.     Breath sounds: Normal breath sounds. No wheezing, rhonchi or rales.  Skin:    General: Skin is warm and dry.  Neurological:     Mental Status: She is alert and oriented to person, place, and time.  Psychiatric:        Mood and Affect: Mood normal.        Behavior: Behavior normal.        Thought Content: Thought content normal.        Judgment: Judgment normal.   No results found for this or any previous visit (from the past 24 hour(s)).     The ASCVD Risk score (Arnett DK, et al.,  2019) failed to calculate for the following reasons:   The 2019 ASCVD risk score is only valid for ages 74 to 87   Assessment & Plan:   1. Anxiety 2. Depression, recurrent (HCC) Continue sertraline 50 mg daily.  Increasing Wellbutrin to 300 mg daily to see if this helps with irritability. - buPROPion (WELLBUTRIN XL) 300 MG 24 hr tablet; Take 1 tablet (300 mg total) by mouth daily.  Dispense: 90 tablet; Refill: 1  3. Prediabetes Checking hemoglobin A1c. - Hemoglobin A1c  4. Hypothyroidism due to Hashimoto's thyroiditis Checking TSH. - TSH  5. Vitamin D deficiency Checking vitamin D. - VITAMIN D 25 Hydroxy (Vit-D Deficiency, Fractures)  6. Preventative health care Checking labs as below. - CBC with Differential/Platelet - COMPLETE METABOLIC PANEL  WITH GFR - Lipid panel  Return in about 6 months (around 12/12/2022) for chronic disease follow up.  ___________________________________________ Clearnce Sorrel, DNP, APRN, FNP-BC [facility] and [place]

## 2022-06-14 LAB — COMPLETE METABOLIC PANEL WITH GFR
AG Ratio: 1.8 (calc) (ref 1.0–2.5)
ALT: 9 U/L (ref 6–29)
AST: 15 U/L (ref 10–30)
Albumin: 4.5 g/dL (ref 3.6–5.1)
Alkaline phosphatase (APISO): 76 U/L (ref 31–125)
BUN/Creatinine Ratio: 13 (calc) (ref 6–22)
BUN: 13 mg/dL (ref 7–25)
CO2: 25 mmol/L (ref 20–32)
Calcium: 8.9 mg/dL (ref 8.6–10.2)
Chloride: 107 mmol/L (ref 98–110)
Creat: 0.99 mg/dL — ABNORMAL HIGH (ref 0.50–0.96)
Globulin: 2.5 g/dL (calc) (ref 1.9–3.7)
Glucose, Bld: 77 mg/dL (ref 65–99)
Potassium: 4.4 mmol/L (ref 3.5–5.3)
Sodium: 141 mmol/L (ref 135–146)
Total Bilirubin: 0.3 mg/dL (ref 0.2–1.2)
Total Protein: 7 g/dL (ref 6.1–8.1)
eGFR: 79 mL/min/{1.73_m2} (ref >=60)

## 2022-06-14 LAB — VITAMIN D 25 HYDROXY (VIT D DEFICIENCY, FRACTURES): Vit D, 25-Hydroxy: 48 ng/mL (ref 30–100)

## 2022-06-14 LAB — LIPID PANEL
Cholesterol: 151 mg/dL (ref <200)
HDL: 33 mg/dL — ABNORMAL LOW (ref >=50)
LDL Cholesterol (Calc): 100 mg/dL (calc) — ABNORMAL HIGH
Non-HDL Cholesterol (Calc): 118 mg/dL (calc) (ref <130)
Total CHOL/HDL Ratio: 4.6 (calc) (ref <5.0)
Triglycerides: 90 mg/dL (ref <150)

## 2022-06-14 LAB — TSH: TSH: 3.03 mIU/L

## 2022-06-14 LAB — CBC WITH DIFFERENTIAL/PLATELET
Absolute Monocytes: 874 cells/uL (ref 200–950)
Basophils Absolute: 38 cells/uL (ref 0–200)
Basophils Relative: 0.5 %
Eosinophils Absolute: 122 cells/uL (ref 15–500)
Eosinophils Relative: 1.6 %
HCT: 34.5 % — ABNORMAL LOW (ref 35.0–45.0)
Hemoglobin: 11.7 g/dL (ref 11.7–15.5)
Lymphs Abs: 2014 cells/uL (ref 850–3900)
MCH: 28.2 pg (ref 27.0–33.0)
MCHC: 33.9 g/dL (ref 32.0–36.0)
MCV: 83.1 fL (ref 80.0–100.0)
MPV: 10.8 fL (ref 7.5–12.5)
Monocytes Relative: 11.5 %
Neutro Abs: 4552 cells/uL (ref 1500–7800)
Neutrophils Relative %: 59.9 %
Platelets: 242 10*3/uL (ref 140–400)
RBC: 4.15 10*6/uL (ref 3.80–5.10)
RDW: 13.9 % (ref 11.0–15.0)
Total Lymphocyte: 26.5 %
WBC: 7.6 10*3/uL (ref 3.8–10.8)

## 2022-06-14 LAB — HEMOGLOBIN A1C
Hgb A1c MFr Bld: 5.5 % of total Hgb (ref <5.7)
Mean Plasma Glucose: 111 mg/dL
eAG (mmol/L): 6.2 mmol/L

## 2022-09-15 ENCOUNTER — Other Ambulatory Visit: Payer: Self-pay | Admitting: Medical-Surgical

## 2022-10-06 ENCOUNTER — Encounter: Payer: Self-pay | Admitting: Medical-Surgical

## 2022-10-17 ENCOUNTER — Ambulatory Visit (INDEPENDENT_AMBULATORY_CARE_PROVIDER_SITE_OTHER): Payer: BC Managed Care – PPO | Admitting: Podiatry

## 2022-10-17 ENCOUNTER — Ambulatory Visit (INDEPENDENT_AMBULATORY_CARE_PROVIDER_SITE_OTHER): Payer: BC Managed Care – PPO

## 2022-10-17 ENCOUNTER — Encounter: Payer: Self-pay | Admitting: Podiatry

## 2022-10-17 DIAGNOSIS — M722 Plantar fascial fibromatosis: Secondary | ICD-10-CM

## 2022-10-17 DIAGNOSIS — M19072 Primary osteoarthritis, left ankle and foot: Secondary | ICD-10-CM | POA: Diagnosis not present

## 2022-10-17 DIAGNOSIS — M79672 Pain in left foot: Secondary | ICD-10-CM

## 2022-10-17 MED ORDER — DEXAMETHASONE SODIUM PHOSPHATE 120 MG/30ML IJ SOLN
4.0000 mg | Freq: Once | INTRAMUSCULAR | Status: AC
Start: 2022-10-17 — End: 2022-10-17
  Administered 2022-10-17: 4 mg via INTRA_ARTICULAR

## 2022-10-17 NOTE — Progress Notes (Signed)
  Subjective:  Patient ID: Lindsey Powell, female    DOB: Feb 28, 1991,   MRN: [id number]  No chief complaint on file.   32 y.o. female presents for concern of left heel pain that has been going on for over 6 months. Relates she started burn boot camp back in October and since then has been getting increasing pain in her heel to the point she can no longer work out. Relates first steps in morning are painful. Relates she has been stretching and has had meloxicam which helps . Denies any other pedal complaints. Denies n/v/f/c.   Past Medical History:  Diagnosis Date   Anxiety    Thyroid disease     Objective:  Physical Exam: Vascular: DP/PT pulses 2/4 bilateral. CFT <3 seconds. Normal hair growth on digits. No edema.  Skin. No lacerations or abrasions bilateral feet.  Musculoskeletal: MMT 5/5 bilateral lower extremities in DF, PF, Inversion and Eversion. Deceased ROM in DF of ankle joint. Tender to medial calcaneal and central tubercle on left foot. No pain along arch or PT or achilles. No pain with calcaneal squeeze.  Neurological: Sensation intact to light touch.   Assessment:   1. Left foot pain      Plan:  Patient was evaluated and treated and all questions answered. Discussed plantar fasciitis with patient.  X-rays reviewed and discussed with patient. No acute fractures or dislocations noted. Mild spurring noted at inferior calcaneus.  Discussed treatment options including, ice, NSAIDS, supportive shoes, bracing, and stretching. Stretching exercises provided to be done on a daily basis.   Continue meloxicam and will refill if needed.  PF brace dispensed.  Patient requesting injection today. Procedure note below.   Follow-up 6 weeks or sooner if any problems arise. In the meantime, encouraged to call the office with any questions, concerns, change in symptoms.   Procedure:  Discussed etiology, pathology, conservative vs. surgical therapies. At this time a plantar fascial  injection was recommended.  The patient agreed and a sterile skin prep was applied.  An injection consisting of  dexamethasone and marcaine mixture was infiltrated at the point of maximal tenderness on the left Heel.  Bandaid applied. The patient tolerated this well and was given instructions for aftercare.     Louann Sjogren, DPM

## 2022-10-17 NOTE — Patient Instructions (Signed)

## 2022-10-26 ENCOUNTER — Other Ambulatory Visit: Payer: Self-pay | Admitting: Medical-Surgical

## 2022-11-27 ENCOUNTER — Ambulatory Visit: Payer: BC Managed Care – PPO | Admitting: Podiatry

## 2022-12-01 ENCOUNTER — Other Ambulatory Visit: Payer: Self-pay | Admitting: Medical-Surgical

## 2022-12-12 ENCOUNTER — Ambulatory Visit: Payer: BC Managed Care – PPO | Admitting: Medical-Surgical

## 2022-12-12 ENCOUNTER — Ambulatory Visit (INDEPENDENT_AMBULATORY_CARE_PROVIDER_SITE_OTHER): Payer: BC Managed Care – PPO | Admitting: Podiatry

## 2022-12-12 ENCOUNTER — Encounter: Payer: Self-pay | Admitting: Podiatry

## 2022-12-12 DIAGNOSIS — M722 Plantar fascial fibromatosis: Secondary | ICD-10-CM

## 2022-12-12 MED ORDER — DEXAMETHASONE SODIUM PHOSPHATE 120 MG/30ML IJ SOLN
4.0000 mg | Freq: Once | INTRAMUSCULAR | Status: AC
Start: 2022-12-12 — End: 2022-12-12
  Administered 2022-12-12: 4 mg via INTRA_ARTICULAR

## 2022-12-12 MED ORDER — TRIAMCINOLONE ACETONIDE 10 MG/ML IJ SUSP
10.0000 mg | Freq: Once | INTRAMUSCULAR | Status: AC
Start: 2022-12-12 — End: 2022-12-12
  Administered 2022-12-12: 10 mg

## 2022-12-12 NOTE — Progress Notes (Signed)
  Subjective:  Patient ID: Lindsey Powell, female    DOB: 05-Jan-1991,   MRN: [id number]  Chief Complaint  Patient presents with   Plantar Fasciitis    Left foot PF pain patient states she has not seen any improvement with her left foot states it is still painful couldn't really tell a difference with injection     32 y.o. female presents for follow-up of left platnar fasciitis. Relates hard to tell if any better. States she doesn't think the injection helped that much. Has been stretching and wearing the brace helps. . Denies n/v/f/c.   Past Medical History:  Diagnosis Date   Anxiety    Thyroid disease     Objective:  Physical Exam: Vascular: DP/PT pulses 2/4 bilateral. CFT <3 seconds. Normal hair growth on digits. No edema.  Skin. No lacerations or abrasions bilateral feet.  Musculoskeletal: MMT 5/5 bilateral lower extremities in DF, PF, Inversion and Eversion. Deceased ROM in DF of ankle joint. Tender to medial calcaneal and central tubercle on left foot. No pain along arch or PT or achilles. No pain with calcaneal squeeze.  Neurological: Sensation intact to light touch.   Assessment:   1. Plantar fasciitis, left       Plan:  Patient was evaluated and treated and all questions answered. Discussed plantar fasciitis with patient.  X-rays reviewed and discussed with patient. No acute fractures or dislocations noted. Mild spurring noted at inferior calcaneus.  Discussed treatment options including, ice, NSAIDS, supportive shoes, bracing, and stretching. Continue stretching and brace Continue meloxicam and will refill if needed.  Patient requesting injection today. Procedure note below.   Follow-up 6 weeks or sooner if any problems arise. In the meantime, encouraged to call the office with any questions, concerns, change in symptoms.   Procedure:  Discussed etiology, pathology, conservative vs. surgical therapies. At this time a plantar fascial injection was recommended.  The  patient agreed and a sterile skin prep was applied.  An injection consisting of  dexamethasone and marcaine mixture was infiltrated at the point of maximal tenderness on the left Heel.  Bandaid applied. The patient tolerated this well and was given instructions for aftercare.     Louann Sjogren, DPM

## 2022-12-22 ENCOUNTER — Encounter: Payer: Self-pay | Admitting: Medical-Surgical

## 2022-12-22 ENCOUNTER — Ambulatory Visit (INDEPENDENT_AMBULATORY_CARE_PROVIDER_SITE_OTHER): Payer: BC Managed Care – PPO | Admitting: Medical-Surgical

## 2022-12-22 VITALS — BP 103/66 | HR 63 | Resp 20 | Ht 64.0 in | Wt 167.8 lb

## 2022-12-22 DIAGNOSIS — R7303 Prediabetes: Secondary | ICD-10-CM | POA: Diagnosis not present

## 2022-12-22 DIAGNOSIS — E611 Iron deficiency: Secondary | ICD-10-CM

## 2022-12-22 DIAGNOSIS — F339 Major depressive disorder, recurrent, unspecified: Secondary | ICD-10-CM

## 2022-12-22 DIAGNOSIS — E063 Autoimmune thyroiditis: Secondary | ICD-10-CM

## 2022-12-22 DIAGNOSIS — F419 Anxiety disorder, unspecified: Secondary | ICD-10-CM

## 2022-12-22 DIAGNOSIS — E038 Other specified hypothyroidism: Secondary | ICD-10-CM | POA: Diagnosis not present

## 2022-12-22 DIAGNOSIS — L680 Hirsutism: Secondary | ICD-10-CM | POA: Diagnosis not present

## 2022-12-22 LAB — POCT GLYCOSYLATED HEMOGLOBIN (HGB A1C): Hemoglobin A1C: 5.6 % (ref 4.0–5.6)

## 2022-12-22 NOTE — Progress Notes (Signed)
        Established patient visit  History, exam, impression, and plan:  1. Prediabetes Pleasant 32 year old female presenting today for follow-up on prediabetes.  She has a very strong family history of diabetes in multiple family members and would like to keep watch on this.  Not quite 2 years ago, her hemoglobin A1c was 6.3% and she was started on metformin.  She is taking metformin 1000 mg once daily, tolerating well without side effects.  Not checking sugars at home.  Working on dietary modifications and trying to stay physically active as tolerated.  Has noted some pins-and-needles sensations in the soles of her feet bilaterally and worries that her A1c may have worsened.  Recheck today showing 5.6% which is in the normal range.  Continue metformin 1000 mg daily as prescribed.  Continue to work on a low-carb diet and aim for regular intentional exercise at least 3 times weekly. - POCT HgB A1C  2. Hypothyroidism due to Hashimoto's thyroiditis History of hypothyroidism due to Hashimoto's thyroiditis.  Not currently on thyroid replacement medications.  Notes that weight gain over the last several months has been an issue despite her continued efforts.  Rechecking TSH and TPO today. - TSH - Thyroid peroxidase antibody  3. Hirsutism Has concerns with excessive facial hair in the setting of prediabetes and weight issues.  Has read that this may be related to PCOS but has never been diagnosed with this.  Checking testosterone today for further evaluation. - Testosterone  4. Iron deficiency History of iron deficiency.  Rechecking iron panel today. - Iron, TIBC and Ferritin Panel  5. Anxiety 6. Depression, recurrent (HCC) Taking Zoloft 25 mg daily and Wellbutrin 300 mg daily.  Tolerating both medications well without side effects.  Feels the medications are working well for her mood and keeping her stable.  Denies SI/HI.  Mood, affect, thought pattern, speech, and cognition all normal today.   Continue Zoloft and Wellbutrin as prescribed.  Procedures performed this visit: None.  Return for annual physical exam at your convenience.  __________________________________ Thayer Ohm, DNP, APRN, FNP-BC Primary Care and Sports Medicine [facility]

## 2022-12-24 LAB — IRON,TIBC AND FERRITIN PANEL
Ferritin: 13 ng/mL — ABNORMAL LOW (ref 15–150)
Iron Saturation: 35 % (ref 15–55)
Iron: 116 ug/dL (ref 27–159)
Total Iron Binding Capacity: 330 ug/dL (ref 250–450)
UIBC: 214 ug/dL (ref 131–425)

## 2022-12-24 LAB — THYROID PEROXIDASE ANTIBODY: Thyroperoxidase Ab SerPl-aCnc: 38 IU/mL — ABNORMAL HIGH (ref 0–34)

## 2022-12-24 LAB — TSH: TSH: 5.09 u[IU]/mL — ABNORMAL HIGH (ref 0.450–4.500)

## 2022-12-24 LAB — TESTOSTERONE: Testosterone: 12 ng/dL — ABNORMAL LOW (ref 13–71)

## 2022-12-25 MED ORDER — LEVOTHYROXINE SODIUM 25 MCG PO TABS: 25.0000 ug | ORAL_TABLET | Freq: Every day | ORAL | 1 refills | Status: DC

## 2022-12-25 NOTE — Addendum Note (Signed)
Addended byChristen Butter on: 12/25/2022 04:58 PM   Modules accepted: Orders

## 2023-01-02 ENCOUNTER — Other Ambulatory Visit: Payer: Self-pay | Admitting: Medical-Surgical

## 2023-01-02 DIAGNOSIS — F339 Major depressive disorder, recurrent, unspecified: Secondary | ICD-10-CM

## 2023-01-02 DIAGNOSIS — F419 Anxiety disorder, unspecified: Secondary | ICD-10-CM

## 2023-01-05 ENCOUNTER — Encounter: Payer: BC Managed Care – PPO | Admitting: Medical-Surgical

## 2023-01-14 ENCOUNTER — Encounter: Payer: Self-pay | Admitting: Medical-Surgical

## 2023-01-17 ENCOUNTER — Other Ambulatory Visit: Payer: Self-pay | Admitting: Medical-Surgical

## 2023-01-20 ENCOUNTER — Other Ambulatory Visit: Payer: Self-pay | Admitting: Medical-Surgical

## 2023-03-02 ENCOUNTER — Other Ambulatory Visit: Payer: Self-pay | Admitting: Medical-Surgical

## 2023-03-17 ENCOUNTER — Encounter: Payer: Self-pay | Admitting: Medical-Surgical

## 2023-03-17 ENCOUNTER — Ambulatory Visit (INDEPENDENT_AMBULATORY_CARE_PROVIDER_SITE_OTHER): Payer: BC Managed Care – PPO | Admitting: Medical-Surgical

## 2023-03-17 VITALS — BP 112/67 | HR 71 | Resp 20 | Ht 64.0 in | Wt 170.7 lb

## 2023-03-17 DIAGNOSIS — R7303 Prediabetes: Secondary | ICD-10-CM | POA: Diagnosis not present

## 2023-03-17 DIAGNOSIS — Z124 Encounter for screening for malignant neoplasm of cervix: Secondary | ICD-10-CM

## 2023-03-17 DIAGNOSIS — R202 Paresthesia of skin: Secondary | ICD-10-CM

## 2023-03-17 DIAGNOSIS — L659 Nonscarring hair loss, unspecified: Secondary | ICD-10-CM

## 2023-03-17 DIAGNOSIS — R635 Abnormal weight gain: Secondary | ICD-10-CM

## 2023-03-17 DIAGNOSIS — L680 Hirsutism: Secondary | ICD-10-CM | POA: Diagnosis not present

## 2023-03-17 DIAGNOSIS — E042 Nontoxic multinodular goiter: Secondary | ICD-10-CM

## 2023-03-17 DIAGNOSIS — F339 Major depressive disorder, recurrent, unspecified: Secondary | ICD-10-CM

## 2023-03-17 MED ORDER — SPIRONOLACTONE 25 MG PO TABS
25.0000 mg | ORAL_TABLET | Freq: Every day | ORAL | 3 refills | Status: AC
Start: 2023-03-17 — End: ?

## 2023-03-17 NOTE — Progress Notes (Signed)
Established patient visit  History, exam, impression, and plan:  1. Paresthesia of lower extremity Pleasant 32 year old female presenting today with reports of tingling that occurs every evening after dinner in her bilateral lower extremities from mid calf down to her ankle.  This has been going on for approximately 3 weeks.  The area is not painful and she has no weakness, numbness, or temperature changes to the lower extremities.  No recent falls or injuries.  No concerning back pain.  Very unclear etiology.  Consider possible vitamin deficiency, low ferritin, medication side effect, or restless leg syndrome.  Checking labs as below.  If all labs look good and no metabolic cause identified, consider adding a medication for restless leg to see if this is beneficial. - Vitamin B12 - VITAMIN D 25 Hydroxy (Vit-D Deficiency, Fractures) - Iron, TIBC and Ferritin Panel  2. Hair loss Reports that over the last several months she has had a worsening of hair loss in the bilateral temples and has seen an increase in hair fall when in the shower.  Was previously started on levothyroxine 25 mcg daily however only took this for 1 month before self discontinuing.  Plan to check labs as below. - TSH - FSH/LH - Prolactin - Testosterone - 17-Hydroxyprogesterone - Estradiol  3. Weight gain Over the last year and a half had lost 30 pounds but over the last few months, has regained most of it back.  No change in diet and continues to go to the gym several days per week.  As noted above, had started levothyroxine for elevated TSH however stopped it after 1 month.  Checking labs. - TSH - FSH/LH - Prolactin - Testosterone - 17-Hydroxyprogesterone - Estradiol  4. Hirsutism History of hirsutism but no diagnosis of PCOS.  Periods were regular with no concerns until after she had her baby and now they have been a little irregular here and there.  She is waxing/shaving the facial hair and notes that she  has to do this every few days because of the rapid growth.  Plan to check hormone levels and thyroid function as noted below.  Starting spironolactone 25 mg daily. - TSH - spironolactone (ALDACTONE) 25 MG tablet; Take 1 tablet (25 mg total) by mouth daily.  Dispense: 90 tablet; Refill: 3 - FSH/LH - Prolactin - Testosterone - 17-Hydroxyprogesterone - Estradiol  5. Depression, recurrent (HCC) She is currently taking sertraline 25 mg daily, tolerating well without side effects.  Today she is very emotional but notes that is the first day of her menstrual cycle as well as being upset over her concerning symptoms discussed during this appointment.  She is tearful at times during the appointment however denies SI/HI.  Feels that the medication is working well for her and does not desire a dose change at this time.  Most of her symptoms she feels are situational.  For now continue sertraline 25 mg daily.  Low threshold for discussing dose increase.  6. Prediabetes She is on metformin and has not been on it for very long however there is a possible risk of low vitamin D.  It is too early to check her A1c but we will check vitamin B12 today.  For now continue metformin as prescribed. - Vitamin B12  7. Multiple thyroid nodules History of multiple thyroid nodules with last ultrasound May 2023.  During this time they found multiple nodules and recommended a 1 year follow-up.  We are a bit overdue  for this today so ordering for completion. - US THYROID; Future  8. Pap smear for cervical cancer screening Referring to OB/GYN per patient preference. - Ambulatory referral to Obstetrics / Gynecology   Procedures performed this visit: None.  Return if symptoms worsen or fail to improve.  __________________________________ Thayer Ohm, DNP, APRN, FNP-BC Primary Care and Sports Medicine [facility]

## 2023-03-18 MED ORDER — LEVOTHYROXINE SODIUM 25 MCG PO TABS: 25.0000 ug | ORAL_TABLET | Freq: Every day | ORAL | 0 refills | Status: DC

## 2023-03-18 NOTE — Addendum Note (Signed)
Addended byChristen Butter on: 03/18/2023 07:57 AM   Modules accepted: Orders

## 2023-03-24 LAB — TSH: TSH: 4.69 u[IU]/mL — ABNORMAL HIGH (ref 0.450–4.500)

## 2023-03-24 LAB — PROLACTIN: Prolactin: 12.8 ng/mL (ref 4.8–33.4)

## 2023-03-24 LAB — IRON,TIBC AND FERRITIN PANEL
Ferritin: 9 ng/mL — ABNORMAL LOW (ref 15–150)
Iron Saturation: 11 % — ABNORMAL LOW (ref 15–55)
Iron: 42 ug/dL (ref 27–159)
Total Iron Binding Capacity: 398 ug/dL (ref 250–450)
UIBC: 356 ug/dL (ref 131–425)

## 2023-03-24 LAB — 17-HYDROXYPROGESTERONE: 17-OH Progesterone LCMS: 18 ng/dL

## 2023-03-24 LAB — VITAMIN B12: Vitamin B-12: 895 pg/mL (ref 232–1245)

## 2023-03-24 LAB — VITAMIN D 25 HYDROXY (VIT D DEFICIENCY, FRACTURES): Vit D, 25-Hydroxy: 39.2 ng/mL (ref 30.0–100.0)

## 2023-03-24 LAB — TESTOSTERONE: Testosterone: 14 ng/dL (ref 13–71)

## 2023-03-24 LAB — FSH/LH
FSH: 5.8 m[IU]/mL
LH: 5.6 m[IU]/mL

## 2023-03-24 LAB — ESTRADIOL: Estradiol: 21.8 pg/mL

## 2023-05-12 ENCOUNTER — Ambulatory Visit
Admission: EM | Admit: 2023-05-12 | Discharge: 2023-05-12 | Disposition: A | Discharge status: Home / Self Care | Payer: BC Managed Care – PPO | Attending: Family Medicine | Admitting: Family Medicine

## 2023-05-12 ENCOUNTER — Encounter: Payer: Self-pay | Admitting: Medical-Surgical

## 2023-05-12 ENCOUNTER — Encounter (HOSPITAL_BASED_OUTPATIENT_CLINIC_OR_DEPARTMENT_OTHER): Payer: Self-pay

## 2023-05-12 ENCOUNTER — Emergency Department (HOSPITAL_BASED_OUTPATIENT_CLINIC_OR_DEPARTMENT_OTHER): Payer: BC Managed Care – PPO

## 2023-05-12 ENCOUNTER — Emergency Department (HOSPITAL_BASED_OUTPATIENT_CLINIC_OR_DEPARTMENT_OTHER)
Admission: EM | Admit: 2023-05-12 | Discharge: 2023-05-12 | Disposition: A | Discharge status: Home / Self Care | Payer: BC Managed Care – PPO | Attending: Emergency Medicine | Admitting: Emergency Medicine

## 2023-05-12 ENCOUNTER — Other Ambulatory Visit: Payer: Self-pay

## 2023-05-12 DIAGNOSIS — E860 Dehydration: Secondary | ICD-10-CM | POA: Diagnosis not present

## 2023-05-12 DIAGNOSIS — R109 Unspecified abdominal pain: Secondary | ICD-10-CM | POA: Diagnosis not present

## 2023-05-12 DIAGNOSIS — N23 Unspecified renal colic: Secondary | ICD-10-CM | POA: Diagnosis not present

## 2023-05-12 DIAGNOSIS — R319 Hematuria, unspecified: Secondary | ICD-10-CM

## 2023-05-12 DIAGNOSIS — R1030 Lower abdominal pain, unspecified: Secondary | ICD-10-CM | POA: Diagnosis not present

## 2023-05-12 DIAGNOSIS — N202 Calculus of kidney with calculus of ureter: Secondary | ICD-10-CM | POA: Diagnosis not present

## 2023-05-12 DIAGNOSIS — N201 Calculus of ureter: Secondary | ICD-10-CM | POA: Insufficient documentation

## 2023-05-12 LAB — CBC
HCT: 37.2 % (ref 36.0–46.0)
Hemoglobin: 12.5 g/dL (ref 12.0–15.0)
MCH: 28.7 pg (ref 26.0–34.0)
MCHC: 33.6 g/dL (ref 30.0–36.0)
MCV: 85.3 fL (ref 80.0–100.0)
Platelets: 226 10*3/uL (ref 150–400)
RBC: 4.36 MIL/uL (ref 3.87–5.11)
RDW: 13.1 % (ref 11.5–15.5)
WBC: 6.5 10*3/uL (ref 4.0–10.5)
nRBC: 0 % (ref 0.0–0.2)

## 2023-05-12 LAB — POCT URINALYSIS DIP (MANUAL ENTRY)
Glucose, UA: NEGATIVE mg/dL
Leukocytes, UA: NEGATIVE
Nitrite, UA: NEGATIVE
Spec Grav, UA: >=1.03 — AB (ref 1.010–1.025)
Urobilinogen, UA: 0.2 U/dL
pH, UA: 5.5 (ref 5.0–8.0)

## 2023-05-12 LAB — COMPREHENSIVE METABOLIC PANEL
ALT: 9 U/L (ref 0–44)
AST: 23 U/L (ref 15–41)
Albumin: 4.3 g/dL (ref 3.5–5.0)
Alkaline Phosphatase: 58 U/L (ref 38–126)
Anion gap: 7 (ref 5–15)
BUN: 11 mg/dL (ref 6–20)
CO2: 25 mmol/L (ref 22–32)
Calcium: 9.1 mg/dL (ref 8.9–10.3)
Chloride: 106 mmol/L (ref 98–111)
Creatinine, Ser: 1.08 mg/dL — ABNORMAL HIGH (ref 0.44–1.00)
GFR, Estimated: >60 mL/min (ref >60)
Glucose, Bld: 108 mg/dL — ABNORMAL HIGH (ref 70–99)
Potassium: 4.1 mmol/L (ref 3.5–5.1)
Sodium: 138 mmol/L (ref 135–145)
Total Bilirubin: 1.1 mg/dL (ref <1.2)
Total Protein: 7.4 g/dL (ref 6.5–8.1)

## 2023-05-12 LAB — POCT URINE PREGNANCY: Preg Test, Ur: NEGATIVE

## 2023-05-12 MED ORDER — ONDANSETRON 4 MG PO TBDP: 4.0000 mg | ORAL_TABLET | Freq: Three times a day (TID) | ORAL | 0 refills | Status: DC | PRN

## 2023-05-12 MED ORDER — OXYCODONE HCL 5 MG PO TABS: 5.0000 mg | ORAL_TABLET | Freq: Four times a day (QID) | ORAL | 0 refills | Status: DC | PRN

## 2023-05-12 NOTE — ED Notes (Signed)
Patient is being discharged from the Urgent Care and sent to the Emergency Department via POV. Per Trevor Iha FNP, patient is in need of higher level of care due to abd pain and need for advanced imaging (CT). Patient is aware and verbalizes understanding of plan of care.  Vitals:   05/12/23 1253  BP: 111/77  Pulse: 71  Resp: 18  Temp: 98.5 F (36.9 C)  SpO2: 98%

## 2023-05-12 NOTE — Discharge Instructions (Addendum)
Advised patient to go to [facility] ED now for further evaluation of right flank pain to include imaging to rule out kidney/ureteral stone).  Encouraged patient to increase daily water intake to 64 ounces per day 7 days/week.

## 2023-05-12 NOTE — ED Notes (Signed)
1x week ago, light pain to RLQ and rotates around to R lower back. Still has appendix. Negative pregnancy test at [facility] urgent care, just came from there.

## 2023-05-12 NOTE — ED Triage Notes (Signed)
Pt c/o RLQ pain that radiates to R flank x1 week. Initially felt like cramps. On Thursday pain worsened; used OTC treatment for constipation. Had BM but did not have relief in pain. States the pain is a constant dull ache with spikes of sharp pain that can last for extended periods of time. States feels similar to labor pains. Denies any n/v. Yesterday had some urinary frequency/urgency, but no dysuria.

## 2023-05-12 NOTE — Discharge Instructions (Signed)
Please read and follow all provided instructions.  Your diagnoses today include:  1. Ureteral colic     Tests performed today include: CT scan which showed a 2 millimeter kidney stone on the right side, also 2mm inside the left kidney not causing a problem Blood test that showed normal kidney function Vital signs. See below for your results today.   Medications prescribed:  Oxycodone - narcotic pain medication  DO NOT drive or perform any activities that require you to be awake and alert because this medicine can make you drowsy.   Zofran (ondansetron) - for nausea and vomiting  Take any prescribed medications only as directed.  Home care instructions:  Follow any educational materials contained in this packet.  Please double your fluid intake for the next several days. Strain your urine and save any stones that may pass.   BE VERY CAREFUL not to take multiple medicines containing Tylenol (also called acetaminophen). Doing so can lead to an overdose which can damage your liver and cause liver failure and possibly death.   Follow-up instructions: Please follow-up with your urologist or the urologist referral (provided on front page) in the next 1 week for further evaluation of your symptoms.  Return instructions:  If you need to return to the Emergency Department, go to [facility] and not [facility]. The urologists are located at [facility] and can better care for you at this location.  Please return to the Emergency Department if you experience worsening symptoms.  Please return if you develop fever or uncontrolled pain or vomiting. Please return if you have any other emergent concerns.  Additional Information:  Your vital signs today were: BP 124/86 (BP Location: Left Arm)   Pulse 77   Temp 97.7 F (36.5 C)   Resp 18   Wt 71.2 kg   LMP 05/08/2023 (Exact Date)   SpO2 99%   BMI 26.95 kg/m  If your blood pressure (BP) was elevated above 135/85 this  visit, please have this repeated by your doctor within one month. --------------

## 2023-05-12 NOTE — ED Notes (Signed)

## 2023-05-12 NOTE — ED Provider Notes (Signed)
[facility]    CSN: [id number] Arrival date & time: 05/12/23  1156      History   Chief Complaint Chief Complaint  Patient presents with   Abdominal Pain    HPI Lindsey Powell is a 32 y.o. female.   HPI pleasant 32 year old female presents with right lower quadrant abdominal pain that radiates to right flank for 1 week.  Patient reports initially felt like cramps then on Thursday pain worsened so she used OTC treatment for constipation.  Reports having bowel movement but no relief of abdominal pain.  Patient reports pains are sharp and feel like labor pains.  Patient denies nausea vomiting.  Patient endorses urinary frequency.  Past Medical History:  Diagnosis Date   Anxiety    Thyroid disease     Patient Active Problem List   Diagnosis Date Noted   Prediabetes 10/02/2021   History of gestational diabetes 08/15/2019   Diet controlled gestational diabetes mellitus (GDM), antepartum 08/11/2018   BMI 30.0-30.9,adult 06/23/2018   Family history of diabetes mellitus in father 06/23/2018   Hypothyroidism due to Hashimoto's thyroiditis 04/22/2017   Vitamin D deficiency 08/28/2016   Anxiety 08/26/2016   Depression, recurrent (HCC) 08/26/2016   Tinea versicolor 08/26/2016   Goiter 08/26/2016    Past Surgical History:  Procedure Laterality Date   ARTHROSCOPIC REPAIR ACL  2010   CESAREAN SECTION      OB History     Gravida  1   Para      Term      Preterm      AB      Living  1      SAB      IAB      Ectopic      Multiple      Live Births               Home Medications    Prior to Admission medications   Medication Sig Start Date End Date Taking? Authorizing Provider  buPROPion (WELLBUTRIN XL) 300 MG 24 hr tablet TAKE 1 TABLET BY MOUTH EVERY DAY 01/02/23  Yes Christen Butter, NP  levothyroxine (SYNTHROID) 25 MCG tablet Take 1 tablet (25 mcg total) by mouth daily before breakfast. HAVE LABS DONE IN 6 WEEKS!! 03/18/23  Yes Jessup, Joy, NP   metFORMIN (GLUCOPHAGE-XR) 500 MG 24 hr tablet TAKE 2 TABLETS BY MOUTH EVERY DAY WITH BREAKFAST 03/02/23  Yes Jessup, Joy, NP  sertraline (ZOLOFT) 25 MG tablet TAKE 1 TABLET (25 MG TOTAL) BY MOUTH DAILY. 01/20/23  Yes Christen Butter, NP  spironolactone (ALDACTONE) 25 MG tablet Take 1 tablet (25 mg total) by mouth daily. 03/17/23  Yes Christen Butter, NP    Family History Family History  Problem Relation Age of Onset   Diabetes Mother    Alcohol abuse Mother    Diabetes Father    Alcohol abuse Father    Cancer Father    Hypertension Father    Hypertension Maternal Aunt    Alcohol abuse Paternal Uncle    Cancer Maternal Grandfather    Cancer Paternal Grandfather     Social History Social History   Tobacco Use   Smoking status: Never   Smokeless tobacco: Never  Vaping Use   Vaping status: Never Used  Substance Use Topics   Alcohol use: Yes    Comment: once a month   Drug use: Never     Allergies   Minocycline and Benzoyl peroxide   Review of Systems Review of  Systems   Physical Exam Triage Vital Signs ED Triage Vitals  Encounter Vitals Group     BP 05/12/23 1253 111/77     Systolic BP Percentile --      Diastolic BP Percentile --      Pulse Rate 05/12/23 1253 71     Resp 05/12/23 1253 18     Temp 05/12/23 1253 98.5 F (36.9 C)     Temp Source 05/12/23 1253 Oral     SpO2 05/12/23 1253 98 %     Weight --      Height --      Head Circumference --      Peak Flow --      Pain Score 05/12/23 1249 2     Pain Loc --      Pain Education --      Exclude from Growth Chart --    No data found.  Updated Vital Signs BP 111/77 (BP Location: Right Arm)   Pulse 71   Temp 98.5 F (36.9 C) (Oral)   Resp 18   LMP 05/08/2023 (Exact Date)   SpO2 98%    Physical Exam Vitals and nursing note reviewed.  Constitutional:      General: She is not in acute distress.    Appearance: She is well-developed. She is obese. She is not ill-appearing.  HENT:     Head: Normocephalic  and atraumatic.     Mouth/Throat:     Mouth: Mucous membranes are moist.     Pharynx: Oropharynx is clear.  Eyes:     Extraocular Movements: Extraocular movements intact.     Pupils: Pupils are equal, round, and reactive to light.  Cardiovascular:     Rate and Rhythm: Normal rate and regular rhythm.     Heart sounds: Normal heart sounds.  Pulmonary:     Effort: Pulmonary effort is normal.     Breath sounds: Normal breath sounds. No wheezing, rhonchi or rales.  Abdominal:     General: Abdomen is flat. Bowel sounds are normal. There is no distension or abdominal bruit. There are no signs of injury.     Palpations: Abdomen is soft. There is no shifting dullness, fluid wave, hepatomegaly, splenomegaly, mass or pulsatile mass.     Tenderness: There is abdominal tenderness in the right lower quadrant and suprapubic area. There is right CVA tenderness. There is no left CVA tenderness, guarding or rebound. Negative signs include Murphy's sign, Rovsing's sign, McBurney's sign and obturator sign.     Hernia: No hernia is present.  Skin:    General: Skin is warm and dry.  Neurological:     Mental Status: She is alert.      UC Treatments / Results  Labs (all labs ordered are listed, but only abnormal results are displayed) Labs Reviewed  POCT URINALYSIS DIP (MANUAL ENTRY) - Abnormal; Notable for the following components:      Result Value   Bilirubin, UA small (*)    Ketones, POC UA trace (5) (*)    Spec Grav, UA >=1.030 (*)    Blood, UA large (*)    Protein Ur, POC trace (*)    All other components within normal limits  POCT URINE PREGNANCY    EKG   Radiology No results found.  Procedures Procedures (including critical care time)  Medications Ordered in UC Medications - No data to display  Initial Impression / Assessment and Plan / UC Course  I have reviewed the triage vital signs and  the nursing notes.  Pertinent labs & imaging results that were available during my care  of the patient were reviewed by me and considered in my medical decision making (see chart for details).     MDM: 1.  Acute right flank pain-Advised patient to go to [facility] ED now for further evaluation of right flank pain to include imaging to rule out kidney/ureteral stone).  Patient agreed and verbalized understanding of these instructions and this plan of care today.  2.  Hematuria, unspecified type-UA revealed above; 3.  Dehydration-UA revealed above, advised patient to increase daily water intake to 64 ounces per day 7 days/week.  Patient discharged, hemodynamically stable. Final Clinical Impressions(s) / UC Diagnoses   Final diagnoses:  Acute right flank pain  Hematuria, unspecified type  Dehydration     Discharge Instructions      Advised patient to go to [facility] ED now for further evaluation of right flank pain to include imaging to rule out kidney/ureteral stone).  Encouraged patient to increase daily water intake to 64 ounces per day 7 days/week.     ED Prescriptions   None    PDMP not reviewed this encounter.   Trevor Iha, FNP 05/12/23 1332

## 2023-05-12 NOTE — ED Triage Notes (Addendum)
Pt sent from UC for right flank pain x 1 week. Initially thought constipated.Pain has increased since last Thursday. Pain varies with intensity. Pt reports urgency

## 2023-05-12 NOTE — ED Provider Notes (Signed)
[facility] [facility] Provider Note   CSN: [id number] Arrival date & time: 05/12/23  1409     History  Chief Complaint  Patient presents with   Flank Pain    Lindsey Powell is a 32 y.o. female.  Patient with no past surgical history presents to the emergency department today for evaluation of right-sided abdominal and flank pain has been ongoing for the past 9 days.  Symptoms have been intermittent, coming and going, but usually nearly every day.  She has had attacks of severe pain with associated nausea and vomiting.  Most recently she had a severe attack last night.  No fevers.  No chest pain or shortness of breath.  She had increased urinary urgency and feeling of incomplete voiding starting yesterday.  She is at the end of her menstrual period currently.  She initially thought her symptoms were due to menstrual cramping or potentially constipation.  She has tried a [employer] with minimal improvement.  No dysuria.  She has not had pain like this in the past.  She was seen in urgent care and referred to the emergency department for further workup.       Home Medications Prior to Admission medications   Medication Sig Start Date End Date Taking? Authorizing Provider  buPROPion (WELLBUTRIN XL) 300 MG 24 hr tablet TAKE 1 TABLET BY MOUTH EVERY DAY 01/02/23   Christen Butter, NP  levothyroxine (SYNTHROID) 25 MCG tablet Take 1 tablet (25 mcg total) by mouth daily before breakfast. HAVE LABS DONE IN 6 WEEKS!! 03/18/23   Christen Butter, NP  metFORMIN (GLUCOPHAGE-XR) 500 MG 24 hr tablet TAKE 2 TABLETS BY MOUTH EVERY DAY WITH BREAKFAST 03/02/23   Christen Butter, NP  sertraline (ZOLOFT) 25 MG tablet TAKE 1 TABLET (25 MG TOTAL) BY MOUTH DAILY. 01/20/23   Christen Butter, NP  spironolactone (ALDACTONE) 25 MG tablet Take 1 tablet (25 mg total) by mouth daily. 03/17/23   Christen Butter, NP      Allergies    Minocycline and Benzoyl peroxide    Review of Systems    Review of Systems  Physical Exam Updated Vital Signs BP 124/86 (BP Location: Left Arm)   Pulse 77   Temp 97.7 F (36.5 C)   Resp 18   Wt 71.2 kg   LMP 05/08/2023 (Exact Date)   SpO2 99%   BMI 26.95 kg/m  Physical Exam Vitals and nursing note reviewed.  Constitutional:      General: She is not in acute distress.    Appearance: She is well-developed.  HENT:     Head: Normocephalic and atraumatic.     Right Ear: External ear normal.     Left Ear: External ear normal.     Nose: Nose normal.  Eyes:     Conjunctiva/sclera: Conjunctivae normal.  Cardiovascular:     Rate and Rhythm: Normal rate and regular rhythm.     Heart sounds: No murmur heard. Pulmonary:     Effort: No respiratory distress.     Breath sounds: No wheezing, rhonchi or rales.  Abdominal:     Palpations: Abdomen is soft.     Tenderness: There is no abdominal tenderness. There is right CVA tenderness (mild). There is no guarding or rebound.  Musculoskeletal:     Cervical back: Normal range of motion and neck supple.     Right lower leg: No edema.     Left lower leg: No edema.  Skin:    General: Skin  is warm and dry.     Findings: No rash.  Neurological:     General: No focal deficit present.     Mental Status: She is alert. Mental status is at baseline.     Motor: No weakness.  Psychiatric:        Mood and Affect: Mood normal.     ED Results / Procedures / Treatments   Labs (all labs ordered are listed, but only abnormal results are displayed) Labs Reviewed  COMPREHENSIVE METABOLIC PANEL - Abnormal; Notable for the following components:      Result Value   Glucose, Bld 108 (*)    Creatinine, Ser 1.08 (*)    All other components within normal limits  CBC  URINALYSIS, ROUTINE W REFLEX MICROSCOPIC    EKG None  Radiology CT Renal Stone Study  Result Date: 05/12/2023 CLINICAL DATA:  Right flank and lower quadrant abdominal pain for the past week. EXAM: CT ABDOMEN AND PELVIS WITHOUT CONTRAST  TECHNIQUE: Multidetector CT imaging of the abdomen and pelvis was performed following the standard protocol without IV contrast. RADIATION DOSE REDUCTION: This exam was performed according to the departmental dose-optimization program which includes automated exposure control, adjustment of the mA and/or kV according to patient size and/or use of iterative reconstruction technique. COMPARISON:  None Available. FINDINGS: Lower chest: No acute abnormality. Hepatobiliary: No focal liver abnormality is seen. No gallstones, gallbladder wall thickening, or biliary dilatation. Pancreas: Unremarkable. No pancreatic ductal dilatation or surrounding inflammatory changes. Spleen: Normal in size without focal abnormality. Adrenals/Urinary Tract: The adrenal glands are unremarkable. 2 mm calculus in the midpole of the left kidney. 2 mm calculus in the distal right ureter near the UVJ with resultant mild right hydroureteronephrosis. The bladder is unremarkable. Stomach/Bowel: Stomach is within normal limits. Appendix appears normal. No evidence of bowel wall thickening, distention, or inflammatory changes. Vascular/Lymphatic: No significant vascular findings are present. No enlarged abdominal or pelvic lymph nodes. Reproductive: Uterus and bilateral adnexa are unremarkable. Other: No abdominal wall hernia or abnormality. No abdominopelvic ascites. No pneumoperitoneum. Musculoskeletal: No acute or significant osseous findings. IMPRESSION: 1. 2 mm calculus in the distal right ureter near the UVJ with resultant mild right hydroureteronephrosis. 2. Additional 2 mm nonobstructive left renal calculus. Electronically Signed   By: Obie Dredge M.D.   On: 05/12/2023 16:29    Procedures Procedures    Medications Ordered in ED Medications - No data to display  ED Course/ Medical Decision Making/ A&P    Patient seen and examined. History obtained directly from patient. Reviewed UCC notes.   Labs/EKG: Ordered CBC, CMP.  UA  and pregnancy at urgent care were negative.  Imaging: Ordered renal protocol CT.  Medications/Fluids: None ordered, patient is comfortable at the present time  Most recent vital signs reviewed and are as follows: BP 124/86 (BP Location: Left Arm)   Pulse 77   Temp 97.7 F (36.5 C)   Resp 18   Wt 71.2 kg   LMP 05/08/2023 (Exact Date)   SpO2 99%   BMI 26.95 kg/m   Initial impression: Right-sided flank pain suggestive of ureteral colic, will confirm with CT imaging and also to evaluate for other cause.  5:09 PM Reassessment performed. Patient appears comfortable, able.  No recurrent significant pain.  Labs personally reviewed and interpreted including: CBC unremarkable; CMP with creatinine 1.08 and a BUN of 11, glucose 108 otherwise unremarkable.  Imaging personally visualized and interpreted including: Renal protocol CT, agree small right sided UVJ stone likely cause of  the patient's symptoms.  Reviewed pertinent lab work and imaging with patient at bedside. Questions answered.   Most current vital signs reviewed and are as follows: BP 124/86 (BP Location: Left Arm)   Pulse 77   Temp 97.7 F (36.5 C)   Resp 18   Wt 71.2 kg   LMP 05/08/2023 (Exact Date)   SpO2 99%   BMI 26.95 kg/m   Plan: Discharge to home.   Prescriptions written for: Oxycodone, Zofran.  Considered Flomax, however stone is very small and distal and Flomax would not likely be of any benefit.  5:10 PM Patient counseled on kidney stone treatment. Urged patient to strain urine and save any stones. Urged urology follow-up. Counseled patient to maintain good fluid intake.   Patient counseled on use of narcotic pain medications. Counseled not to combine these medications with others containing tylenol. Urged not to drink alcohol, drive, or perform any other activities that requires focus while taking these medications. The patient verbalizes understanding and agrees with the plan.                                   Medical Decision Making Amount and/or Complexity of Data Reviewed Labs: ordered. Radiology: ordered.  Risk Prescription drug management.   For this patient's complaint of abdominal pain, the following conditions were considered on the differential diagnosis: gastritis/PUD, enteritis/duodenitis, appendicitis, cholelithiasis/cholecystitis, cholangitis, pancreatitis, ruptured viscus, colitis, diverticulitis, small/large bowel obstruction, proctitis, cystitis, pyelonephritis, ureteral colic, aortic dissection, aortic aneurysm. In women, ectopic pregnancy, pelvic inflammatory disease, ovarian cysts, and tubo-ovarian abscess were also considered. Atypical chest etiologies were also considered including ACS, PE, and pneumonia.  Renal protocol CT confirms distal UVJ stone on the right side consistent with patient's symptoms.  Otherwise no acute findings.  Symptoms controlled, no active vomiting, pain controlled.  Home treatment with urology follow-up is reasonable.  The patient's vital signs, pertinent lab work and imaging were reviewed and interpreted as discussed in the ED course. Hospitalization was considered for further testing, treatments, or serial exams/observation. However as patient is well-appearing, has a stable exam, and reassuring studies today, I do not feel that they warrant admission at this time. This plan was discussed with the patient who verbalizes agreement and comfort with this plan and seems reliable and able to return to the Emergency Department with worsening or changing symptoms.          Final Clinical Impression(s) / ED Diagnoses Final diagnoses:  Ureteral colic    Rx / DC Orders ED Discharge Orders          Ordered    oxyCODONE (OXY IR/ROXICODONE) 5 MG immediate release tablet  Every 6 hours PRN        05/12/23 1707    ondansetron (ZOFRAN-ODT) 4 MG disintegrating tablet  Every 8 hours PRN        05/12/23 1707              Renne Crigler,  PA-C 05/12/23 1711    Ernie Avena, MD 05/14/23 1920

## 2023-05-13 ENCOUNTER — Other Ambulatory Visit: Payer: Self-pay

## 2023-05-13 ENCOUNTER — Encounter (HOSPITAL_BASED_OUTPATIENT_CLINIC_OR_DEPARTMENT_OTHER): Payer: Self-pay | Admitting: Urology

## 2023-05-13 ENCOUNTER — Ambulatory Visit (HOSPITAL_BASED_OUTPATIENT_CLINIC_OR_DEPARTMENT_OTHER)
Admission: EM | Admit: 2023-05-13 | Discharge: 2023-05-14 | Disposition: A | Discharge status: Home / Self Care | Payer: BC Managed Care – PPO | Attending: Emergency Medicine | Admitting: Emergency Medicine

## 2023-05-13 ENCOUNTER — Emergency Department (HOSPITAL_BASED_OUTPATIENT_CLINIC_OR_DEPARTMENT_OTHER): Payer: BC Managed Care – PPO

## 2023-05-13 DIAGNOSIS — R112 Nausea with vomiting, unspecified: Secondary | ICD-10-CM | POA: Diagnosis not present

## 2023-05-13 DIAGNOSIS — N132 Hydronephrosis with renal and ureteral calculous obstruction: Secondary | ICD-10-CM | POA: Diagnosis not present

## 2023-05-13 DIAGNOSIS — N139 Obstructive and reflux uropathy, unspecified: Secondary | ICD-10-CM | POA: Diagnosis not present

## 2023-05-13 DIAGNOSIS — R7303 Prediabetes: Secondary | ICD-10-CM | POA: Diagnosis not present

## 2023-05-13 DIAGNOSIS — N179 Acute kidney failure, unspecified: Secondary | ICD-10-CM | POA: Diagnosis not present

## 2023-05-13 LAB — COMPREHENSIVE METABOLIC PANEL
ALT: 10 U/L (ref 0–44)
AST: 19 U/L (ref 15–41)
Albumin: 4.9 g/dL (ref 3.5–5.0)
Alkaline Phosphatase: 62 U/L (ref 38–126)
Anion gap: 12 (ref 5–15)
BUN: 16 mg/dL (ref 6–20)
CO2: 19 mmol/L — ABNORMAL LOW (ref 22–32)
Calcium: 9.3 mg/dL (ref 8.9–10.3)
Chloride: 107 mmol/L (ref 98–111)
Creatinine, Ser: 1.61 mg/dL — ABNORMAL HIGH (ref 0.44–1.00)
GFR, Estimated: 44 mL/min — ABNORMAL LOW (ref >60)
Glucose, Bld: 123 mg/dL — ABNORMAL HIGH (ref 70–99)
Potassium: 3.8 mmol/L (ref 3.5–5.1)
Sodium: 138 mmol/L (ref 135–145)
Total Bilirubin: 0.9 mg/dL (ref <1.2)
Total Protein: 7.9 g/dL (ref 6.5–8.1)

## 2023-05-13 LAB — CBC WITH DIFFERENTIAL/PLATELET
Abs Immature Granulocytes: 0.04 10*3/uL (ref 0.00–0.07)
Basophils Absolute: 0 10*3/uL (ref 0.0–0.1)
Basophils Relative: 0 %
Eosinophils Absolute: 0 10*3/uL (ref 0.0–0.5)
Eosinophils Relative: 0 %
HCT: 39.7 % (ref 36.0–46.0)
Hemoglobin: 13.5 g/dL (ref 12.0–15.0)
Immature Granulocytes: 0 %
Lymphocytes Relative: 10 %
Lymphs Abs: 1.5 10*3/uL (ref 0.7–4.0)
MCH: 28.7 pg (ref 26.0–34.0)
MCHC: 34 g/dL (ref 30.0–36.0)
MCV: 84.3 fL (ref 80.0–100.0)
Monocytes Absolute: 0.8 10*3/uL (ref 0.1–1.0)
Monocytes Relative: 5 %
Neutro Abs: 12.2 10*3/uL — ABNORMAL HIGH (ref 1.7–7.7)
Neutrophils Relative %: 85 %
Platelets: 246 10*3/uL (ref 150–400)
RBC: 4.71 MIL/uL (ref 3.87–5.11)
RDW: 13.1 % (ref 11.5–15.5)
WBC: 14.5 10*3/uL — ABNORMAL HIGH (ref 4.0–10.5)
nRBC: 0 % (ref 0.0–0.2)

## 2023-05-13 LAB — URINALYSIS, ROUTINE W REFLEX MICROSCOPIC
Bilirubin Urine: NEGATIVE
Glucose, UA: NEGATIVE mg/dL
Ketones, ur: >=80 mg/dL — AB
Leukocytes,Ua: NEGATIVE
Nitrite: NEGATIVE
Protein, ur: NEGATIVE mg/dL
Specific Gravity, Urine: 1.02 (ref 1.005–1.030)
pH: 8.5 — ABNORMAL HIGH (ref 5.0–8.0)

## 2023-05-13 LAB — URINALYSIS, MICROSCOPIC (REFLEX)

## 2023-05-13 LAB — PREGNANCY, URINE: Preg Test, Ur: NEGATIVE

## 2023-05-13 MED ORDER — SODIUM CHLORIDE 0.9 % IV BOLUS
1000.0000 mL | Freq: Once | INTRAVENOUS | Status: AC
  Administered 2023-05-13: 1000 mL via INTRAVENOUS

## 2023-05-13 MED ORDER — SODIUM CHLORIDE 0.9 % IV SOLN
1.0000 g | Freq: Once | INTRAVENOUS | Status: AC
  Administered 2023-05-13: 1 g via INTRAVENOUS
  Filled 2023-05-13: qty 10

## 2023-05-13 MED ORDER — KETOROLAC TROMETHAMINE 15 MG/ML IJ SOLN
15.0000 mg | Freq: Once | INTRAMUSCULAR | Status: AC
  Administered 2023-05-13: 15 mg via INTRAVENOUS
  Filled 2023-05-13: qty 1

## 2023-05-13 MED ORDER — ONDANSETRON HCL 4 MG/2ML IJ SOLN
4.0000 mg | Freq: Once | INTRAMUSCULAR | Status: AC
  Administered 2023-05-13: 4 mg via INTRAVENOUS
  Filled 2023-05-13: qty 2

## 2023-05-13 MED ORDER — MORPHINE SULFATE (PF) 4 MG/ML IV SOLN
4.0000 mg | Freq: Once | INTRAVENOUS | Status: AC
  Administered 2023-05-13: 4 mg via INTRAVENOUS
  Filled 2023-05-13: qty 1

## 2023-05-13 MED ORDER — HYDROMORPHONE HCL 1 MG/ML IJ SOLN
1.0000 mg | Freq: Once | INTRAMUSCULAR | Status: AC
  Administered 2023-05-13: 1 mg via INTRAVENOUS
  Filled 2023-05-13: qty 1

## 2023-05-13 MED ORDER — IOHEXOL 300 MG/ML  SOLN
80.0000 mL | Freq: Once | INTRAMUSCULAR | Status: AC | PRN
  Administered 2023-05-13: 80 mL via INTRAVENOUS

## 2023-05-13 NOTE — ED Notes (Signed)
Patient transported to CT 

## 2023-05-13 NOTE — ED Notes (Signed)
[facility] called for ED to ED transport, No ETA. Patient is going to ED [facility] ED Nurse will call floor ED to give report Called @ 23:28

## 2023-05-13 NOTE — ED Provider Notes (Signed)
[place] [facility] AT [facility] Provider Note  CSN: [id number] Arrival date & time: 05/13/23 2047  Chief Complaint(s) Nephrolithiasis  HPI Lindsey Powell is a 32 y.o. female with past medical history as below, significant for prediabetes, anxiety, depression, who presents to the ED with complaint of abdominal pain, nausea vomiting  She was seen yesterday, diagnosed with right-sided nephrolithiasis.  Discharged in stable condition.  Symptoms worsened over the past 24 hours, nausea vomiting poor p.o. intake.  Unable to tolerate p.o. since early this morning.  Vomiting immediately after any kind of p.o. intake.  Reduced urine output.  Worsening abdominal pain to the right lower quadrant and right flank.  Has not been able to keep her pain medication down.  No history of kidney stone prior to recent diagnosis  Past Medical History Past Medical History:  Diagnosis Date   Anxiety    Thyroid disease    Patient Active Problem List   Diagnosis Date Noted   Prediabetes 10/02/2021   History of gestational diabetes 08/15/2019   Diet controlled gestational diabetes mellitus (GDM), antepartum 08/11/2018   BMI 30.0-30.9,adult 06/23/2018   Family history of diabetes mellitus in father 06/23/2018   Hypothyroidism due to Hashimoto's thyroiditis 04/22/2017   Vitamin D deficiency 08/28/2016   Anxiety 08/26/2016   Depression, recurrent (HCC) 08/26/2016   Tinea versicolor 08/26/2016   Goiter 08/26/2016   Home Medication(s) Prior to Admission medications   Medication Sig Start Date End Date Taking? Authorizing Provider  buPROPion (WELLBUTRIN XL) 300 MG 24 hr tablet TAKE 1 TABLET BY MOUTH EVERY DAY 01/02/23   Christen Butter, NP  levothyroxine (SYNTHROID) 25 MCG tablet Take 1 tablet (25 mcg total) by mouth daily before breakfast. HAVE LABS DONE IN 6 WEEKS!! 03/18/23   Christen Butter, NP  metFORMIN (GLUCOPHAGE-XR) 500 MG 24 hr tablet TAKE 2 TABLETS BY MOUTH EVERY DAY WITH BREAKFAST  03/02/23   Christen Butter, NP  ondansetron (ZOFRAN-ODT) 4 MG disintegrating tablet Take 1 tablet (4 mg total) by mouth every 8 (eight) hours as needed for nausea or vomiting. 05/12/23   Renne Crigler, PA-C  oxyCODONE (OXY IR/ROXICODONE) 5 MG immediate release tablet Take 1 tablet (5 mg total) by mouth every 6 (six) hours as needed for severe pain (pain score 7-10). 05/12/23   Renne Crigler, PA-C  sertraline (ZOLOFT) 25 MG tablet TAKE 1 TABLET (25 MG TOTAL) BY MOUTH DAILY. 01/20/23   Christen Butter, NP  spironolactone (ALDACTONE) 25 MG tablet Take 1 tablet (25 mg total) by mouth daily. 03/17/23   Christen Butter, NP                                                                                                                                    Past Surgical History Past Surgical History:  Procedure Laterality Date   ARTHROSCOPIC REPAIR ACL  2010   CESAREAN SECTION     Family History Family History  Problem  Relation Age of Onset   Diabetes Mother    Alcohol abuse Mother    Diabetes Father    Alcohol abuse Father    Cancer Father    Hypertension Father    Hypertension Maternal Aunt    Alcohol abuse Paternal Uncle    Cancer Maternal Grandfather    Cancer Paternal Grandfather     Social History Social History   Tobacco Use   Smoking status: Never   Smokeless tobacco: Never  Vaping Use   Vaping status: Never Used  Substance Use Topics   Alcohol use: Yes    Comment: once a month   Drug use: Never   Allergies Minocycline and Benzoyl peroxide  Review of Systems Review of Systems  Constitutional:  Negative for chills and fever.  Respiratory:  Negative for shortness of breath.   Cardiovascular:  Negative for chest pain.  Gastrointestinal:  Positive for abdominal pain, nausea and vomiting. Negative for diarrhea.  Genitourinary:  Positive for decreased urine volume. Negative for hematuria.  All other systems reviewed and are negative.   Physical Exam Vital Signs  I have reviewed the  triage vital signs BP 119/75   Pulse 83   Temp 97.6 F (36.4 C) (Oral)   Resp 20   Ht 5\' 4"  (1.626 m)   Wt 71.2 kg   LMP 05/08/2023 (Exact Date)   SpO2 94%   BMI 26.94 kg/m  Physical Exam Vitals and nursing note reviewed.  Constitutional:      Appearance: Normal appearance. She is not toxic-appearing.  HENT:     Head: Normocephalic and atraumatic.     Right Ear: External ear normal.     Left Ear: External ear normal.     Nose: Nose normal.     Mouth/Throat:     Mouth: Mucous membranes are moist.  Eyes:     General: No scleral icterus.       Right eye: No discharge.        Left eye: No discharge.  Cardiovascular:     Rate and Rhythm: Normal rate and regular rhythm.     Pulses: Normal pulses.     Heart sounds: Normal heart sounds.  Pulmonary:     Effort: Pulmonary effort is normal. No respiratory distress.     Breath sounds: Normal breath sounds. No stridor.  Abdominal:     General: Abdomen is flat. There is no distension.     Palpations: Abdomen is soft.     Tenderness: There is abdominal tenderness.    Musculoskeletal:       Arms:     Cervical back: No rigidity.     Right lower leg: No edema.     Left lower leg: No edema.  Skin:    General: Skin is warm and dry.     Capillary Refill: Capillary refill takes less than 2 seconds.  Neurological:     Mental Status: She is alert.  Psychiatric:        Mood and Affect: Mood normal.        Behavior: Behavior normal. Behavior is cooperative.     ED Results and Treatments Labs (all labs ordered are listed, but only abnormal results are displayed) Labs Reviewed  CBC WITH DIFFERENTIAL/PLATELET - Abnormal; Notable for the following components:      Result Value   WBC 14.5 (*)    Neutro Abs 12.2 (*)    All other components within normal limits  COMPREHENSIVE METABOLIC PANEL - Abnormal; Notable for the following  components:   CO2 19 (*)    Glucose, Bld 123 (*)    Creatinine, Ser 1.61 (*)    GFR, Estimated 44 (*)     All other components within normal limits  URINALYSIS, ROUTINE W REFLEX MICROSCOPIC - Abnormal; Notable for the following components:   APPearance CLOUDY (*)    pH 8.5 (*)    Hgb urine dipstick TRACE (*)    Ketones, ur >=80 (*)    All other components within normal limits  URINALYSIS, MICROSCOPIC (REFLEX) - Abnormal; Notable for the following components:   Bacteria, UA FEW (*)    All other components within normal limits  URINE CULTURE  PREGNANCY, URINE                                                                                                                          Radiology CT ABDOMEN PELVIS W CONTRAST  Result Date: 05/13/2023 CLINICAL DATA:  Right lower quadrant pain. Follow-up right-sided kidney stone from yesterday. EXAM: CT ABDOMEN AND PELVIS WITH CONTRAST TECHNIQUE: Multidetector CT imaging of the abdomen and pelvis was performed using the standard protocol following bolus administration of intravenous contrast. RADIATION DOSE REDUCTION: This exam was performed according to the departmental dose-optimization program which includes automated exposure control, adjustment of the mA and/or kV according to patient size and/or use of iterative reconstruction technique. CONTRAST:  80mL OMNIPAQUE IOHEXOL 300 MG/ML  SOLN COMPARISON:  CT without contrast yesterday at 3:27 p.m. FINDINGS: Lower chest: No abnormality. Hepatobiliary: There is a nonspecific 5 mm hypodensity in hepatic segment 4A on 301:16, and a 4 mm nonspecific hypodensity inferiorly in hepatic segment 5 on 301:28. Both of these are too small to characterize. There is no hepatic mass enhancement. Mild periligamentous focal fat is present in segment 4B. The gallbladder and bile ducts are unremarkable. Pancreas: No abnormality. Spleen: Slightly prominent spleen 13.3 cm in length without mass enhancement. Adrenals/Urinary Tract: No adrenal or renal mass enhancement. On the left there are punctate up to 1 mm nonobstructive caliceal  stones in the lower pole. On the right, there is interval worsening now moderate hydroureteronephrosis, with cortical contrast retention due to again noted 2 mm stone lodged in the UVJ, was previously in the most distal ureter. Please correlate clinically for infectious complication. There is minimal new right perinephric fluid which could be due to the obstructive uropathy or a caliceal leak. The bladder is unremarkable for the degree of distention. Stomach/Bowel: No dilatation or wall thickening including the appendix. Vascular/Lymphatic: No significant vascular findings are present. No enlarged abdominal or pelvic lymph nodes. Reproductive: Uterus and bilateral adnexa are unremarkable. Other: No abdominal wall hernia or abnormality. Trace low-density fluid in the cul-de-sac is unchanged, probably physiologic given age. There is no free hemorrhage, free air or abscess. Musculoskeletal: There is a healed fracture deformity of the left L5 pars. No acute or other significant osseous findings. IMPRESSION: 1. Interval worsening now moderate right hydroureteronephrosis due to 2 mm stone lodged  in the UVJ, was previously in the most distal ureter. Correlate clinically for infectious complication. 2. Minimal new right perinephric fluid which could be due to the obstructive uropathy or a caliceal leak. 3. Nonobstructive left nephrolithiasis. 4. Two nonspecific hypodensities in the liver measuring up to 5 mm, too small to characterize. No follow-up imaging is recommended if the patient is low risk. In a high-risk patient, follow-up MRI 6-12 months without and with contrast would be warranted. 5. Slightly prominent spleen. 6. Trace low-density fluid in the cul-de-sac, unchanged, probably physiologic given age. 7. Healed fracture deformity of the left L5 pars. Electronically Signed   By: Almira Bar M.D.   On: 05/13/2023 22:49    Pertinent labs & imaging results that were available during my care of the patient were  reviewed by me and considered in my medical decision making (see MDM for details).  Medications Ordered in ED Medications  cefTRIAXone (ROCEPHIN) 1 g in sodium chloride 0.9 % 100 mL IVPB (has no administration in time range)  sodium chloride 0.9 % bolus 1,000 mL (0 mLs Intravenous Stopped 05/13/23 2235)  ondansetron (ZOFRAN) injection 4 mg (4 mg Intravenous Given 05/13/23 2115)  morphine (PF) 4 MG/ML injection 4 mg (4 mg Intravenous Given 05/13/23 2131)  iohexol (OMNIPAQUE) 300 MG/ML solution 80 mL (80 mLs Intravenous Contrast Given 05/13/23 2228)  HYDROmorphone (DILAUDID) injection 1 mg (1 mg Intravenous Given 05/13/23 2234)  ketorolac (TORADOL) 15 MG/ML injection 15 mg (15 mg Intravenous Given 05/13/23 2233)  sodium chloride 0.9 % bolus 1,000 mL (1,000 mLs Intravenous New Bag/Given 05/13/23 2237)  ondansetron (ZOFRAN) injection 4 mg (4 mg Intravenous Given 05/13/23 2237)                                                                                                                                     Procedures .Critical Care  Performed by: Sloan Leiter, DO Authorized by: Sloan Leiter, DO   Critical care provider statement:    Critical care time (minutes):  34   Critical care time was exclusive of:  Separately billable procedures and treating other patients   Critical care was necessary to treat or prevent imminent or life-threatening deterioration of the following conditions:  Renal failure and dehydration   Critical care was time spent personally by me on the following activities:  Development of treatment plan with patient or surrogate, discussions with consultants, evaluation of patient's response to treatment, examination of patient, ordering and review of laboratory studies, ordering and review of radiographic studies, ordering and performing treatments and interventions, pulse oximetry, re-evaluation of patient's condition, review of old charts and obtaining history from patient  or surrogate   Care discussed with: accepting provider at another facility     (including critical care time)  Medical Decision Making / ED Course    Medical Decision Making:    Lindsey Powell is a 32 y.o. female  with  past medical history as below, significant for prediabetes, anxiety, depression, who presents to the ED with complaint of abdominal pain, nausea vomiting. The complaint involves an extensive differential diagnosis and also carries with it a high risk of complications and morbidity.  Serious etiology was considered. Ddx includes but is not limited to: Differential diagnosis includes but is not exclusive to ectopic pregnancy, ovarian cyst, ovarian torsion, acute appendicitis, urinary tract infection, endometriosis, bowel obstruction, hernia, colitis, renal colic, gastroenteritis, volvulus etc.   Complete initial physical exam performed, notably the patient was in severe discomfort, retching in the room.  HDS.Marland Kitchen    Reviewed and confirmed nursing documentation for past medical history, family history, social history.  Vital signs reviewed.      Clinical Course as of 05/13/23 2320  Wed May 13, 2023  2155 Creatinine(!): 1.61 AKI [SG]  2311 Spoke w/ Dr Laverle Patter with urology, recommend send to [facility] ED for eval.  [SG]    Clinical Course User Index [SG] Sloan Leiter, DO    Brief summary: 32 year old female with recent nephrolithiasis, here with intractable nausea and vomiting, unable to take home medications due to vomiting.  CMP with AKI, she also has leukocytosis WBC 14.5.  Urinalysis with few bacteria, 0-5 WBCs.  Pregnancy negative.  CT imaging shows progressive hydroureteronephrosis, also possible calyceal leak.  Patient was given empiric Rocephin and urine culture sent.  Discussed with Dr. Laverle Patter with urology who recommends sending the patient to [facility], ER for evaluation.  Excepted by Dr. Blinda Leatherwood for transfer. Pt/family agreeable with plan for transfer, symptoms have  improved with intervention here.               Additional history obtained: -Additional history obtained from family -External records from outside source obtained and reviewed including: Chart review including previous notes, labs, imaging, consultation notes including  Recent ED visit Home meds Prior labs/imaging   Lab Tests: -I ordered, reviewed, and interpreted labs.   The pertinent results include:   Labs Reviewed  CBC WITH DIFFERENTIAL/PLATELET - Abnormal; Notable for the following components:      Result Value   WBC 14.5 (*)    Neutro Abs 12.2 (*)    All other components within normal limits  COMPREHENSIVE METABOLIC PANEL - Abnormal; Notable for the following components:   CO2 19 (*)    Glucose, Bld 123 (*)    Creatinine, Ser 1.61 (*)    GFR, Estimated 44 (*)    All other components within normal limits  URINALYSIS, ROUTINE W REFLEX MICROSCOPIC - Abnormal; Notable for the following components:   APPearance CLOUDY (*)    pH 8.5 (*)    Hgb urine dipstick TRACE (*)    Ketones, ur >=80 (*)    All other components within normal limits  URINALYSIS, MICROSCOPIC (REFLEX) - Abnormal; Notable for the following components:   Bacteria, UA FEW (*)    All other components within normal limits  URINE CULTURE  PREGNANCY, URINE    Notable for as above  EKG   EKG Interpretation Date/Time:    Ventricular Rate:    PR Interval:    QRS Duration:    QT Interval:    QTC Calculation:   R Axis:      Text Interpretation:           Imaging Studies ordered: I ordered imaging studies including CT AP I independently visualized the following imaging with scope of interpretation limited to determining acute life threatening conditions related to emergency care;  findings noted above I independently visualized and interpreted imaging. I agree with the radiologist interpretation   Medicines ordered and prescription drug management: Meds ordered this encounter   Medications   sodium chloride 0.9 % bolus 1,000 mL   ondansetron (ZOFRAN) injection 4 mg   morphine (PF) 4 MG/ML injection 4 mg   iohexol (OMNIPAQUE) 300 MG/ML solution 80 mL   HYDROmorphone (DILAUDID) injection 1 mg   ketorolac (TORADOL) 15 MG/ML injection 15 mg   sodium chloride 0.9 % bolus 1,000 mL   ondansetron (ZOFRAN) injection 4 mg   cefTRIAXone (ROCEPHIN) 1 g in sodium chloride 0.9 % 100 mL IVPB    Order Specific Question:   Antibiotic Indication:    Answer:   UTI    -I have reviewed the patients home medicines and have made adjustments as needed   Consultations Obtained: I requested consultation with the urology ,  and discussed lab and imaging findings as well as pertinent plan - they recommend: send to [facility] ED   Cardiac Monitoring: Continuous pulse oximetry interpreted by myself, 96% on RA.    Social Determinants of Health:  Diagnosis or treatment significantly limited by social determinants of health: lives at home   Reevaluation: After the interventions noted above, I reevaluated the patient and found that they have improved  Co morbidities that complicate the patient evaluation  Past Medical History:  Diagnosis Date   Anxiety    Thyroid disease       Dispostion: Disposition decision including need for hospitalization was considered, and patient transferred.    Final Clinical Impression(s) / ED Diagnoses Final diagnoses:  AKI (acute kidney injury) (HCC)  Intractable nausea and vomiting  Obstructive uropathy        Sloan Leiter, DO 05/13/23 2320

## 2023-05-13 NOTE — ED Triage Notes (Signed)
Was seen yesterday for renal stone  Continued pain ti right flank and vomiting  Unable to keep meds down that were prescribed yesterday

## 2023-05-14 ENCOUNTER — Emergency Department (HOSPITAL_COMMUNITY): Payer: BC Managed Care – PPO | Admitting: Anesthesiology

## 2023-05-14 ENCOUNTER — Emergency Department (HOSPITAL_COMMUNITY): Payer: BC Managed Care – PPO

## 2023-05-14 ENCOUNTER — Encounter (HOSPITAL_COMMUNITY)
Admission: EM | Disposition: A | Discharge status: Home / Self Care | Payer: Self-pay | Source: Home / Self Care | Attending: Emergency Medicine

## 2023-05-14 DIAGNOSIS — R112 Nausea with vomiting, unspecified: Secondary | ICD-10-CM | POA: Diagnosis not present

## 2023-05-14 DIAGNOSIS — N132 Hydronephrosis with renal and ureteral calculous obstruction: Secondary | ICD-10-CM | POA: Diagnosis not present

## 2023-05-14 DIAGNOSIS — R7303 Prediabetes: Secondary | ICD-10-CM | POA: Diagnosis not present

## 2023-05-14 DIAGNOSIS — N179 Acute kidney failure, unspecified: Secondary | ICD-10-CM | POA: Diagnosis not present

## 2023-05-14 DIAGNOSIS — R52 Pain, unspecified: Secondary | ICD-10-CM | POA: Diagnosis not present

## 2023-05-14 DIAGNOSIS — N201 Calculus of ureter: Secondary | ICD-10-CM | POA: Diagnosis not present

## 2023-05-14 HISTORY — PX: HOLMIUM LASER APPLICATION: SHX5852

## 2023-05-14 HISTORY — PX: CYSTOSCOPY WITH RETROGRADE PYELOGRAM, URETEROSCOPY AND STENT PLACEMENT: SHX5789

## 2023-05-14 SURGERY — CYSTOURETEROSCOPY, WITH RETROGRADE PYELOGRAM AND STENT INSERTION
Anesthesia: General | Laterality: Right

## 2023-05-14 MED ORDER — IOHEXOL 300 MG/ML  SOLN
INTRAMUSCULAR | Status: DC | PRN
  Administered 2023-05-14: 2 mL

## 2023-05-14 MED ORDER — OXYCODONE HCL 5 MG PO TABS: 5.0000 mg | ORAL_TABLET | ORAL | 0 refills | Status: DC | PRN

## 2023-05-14 MED ORDER — DEXAMETHASONE SODIUM PHOSPHATE 10 MG/ML IJ SOLN
INTRAMUSCULAR | Status: AC
  Filled 2023-05-14: qty 1

## 2023-05-14 MED ORDER — SODIUM CHLORIDE 0.9 % IV SOLN: INTRAVENOUS | Status: DC

## 2023-05-14 MED ORDER — OXYCODONE HCL 5 MG/5ML PO SOLN: 5.0000 mg | Freq: Once | ORAL | Status: DC | PRN

## 2023-05-14 MED ORDER — LIDOCAINE HCL (PF) 2 % IJ SOLN
INTRAMUSCULAR | Status: AC
  Filled 2023-05-14: qty 5

## 2023-05-14 MED ORDER — PROPOFOL 10 MG/ML IV BOLUS
INTRAVENOUS | Status: AC
  Filled 2023-05-14: qty 20

## 2023-05-14 MED ORDER — CHLORHEXIDINE GLUCONATE 0.12 % MT SOLN
15.0000 mL | Freq: Once | OROMUCOSAL | Status: AC
  Administered 2023-05-14: 15 mL via OROMUCOSAL

## 2023-05-14 MED ORDER — ACETAMINOPHEN 10 MG/ML IV SOLN
1000.0000 mg | Freq: Once | INTRAVENOUS | Status: DC | PRN
  Administered 2023-05-14: 1000 mg via INTRAVENOUS

## 2023-05-14 MED ORDER — LIDOCAINE HCL (CARDIAC) PF 100 MG/5ML IV SOSY
PREFILLED_SYRINGE | INTRAVENOUS | Status: DC | PRN
  Administered 2023-05-14: 80 mg via INTRAVENOUS

## 2023-05-14 MED ORDER — OXYCODONE HCL 5 MG PO TABS: 5.0000 mg | ORAL_TABLET | Freq: Once | ORAL | Status: DC | PRN

## 2023-05-14 MED ORDER — ONDANSETRON HCL 4 MG/2ML IJ SOLN
INTRAMUSCULAR | Status: DC | PRN
  Administered 2023-05-14: 4 mg via INTRAVENOUS

## 2023-05-14 MED ORDER — KETOROLAC TROMETHAMINE 30 MG/ML IJ SOLN
INTRAMUSCULAR | Status: AC
  Filled 2023-05-14: qty 1

## 2023-05-14 MED ORDER — HYDROMORPHONE HCL 1 MG/ML IJ SOLN
1.0000 mg | INTRAMUSCULAR | Status: DC | PRN
  Administered 2023-05-14 (×2): 1 mg via INTRAVENOUS
  Filled 2023-05-14 (×2): qty 1

## 2023-05-14 MED ORDER — ACETAMINOPHEN 10 MG/ML IV SOLN
INTRAVENOUS | Status: AC
  Filled 2023-05-14: qty 100

## 2023-05-14 MED ORDER — FENTANYL CITRATE PF 50 MCG/ML IJ SOSY
PREFILLED_SYRINGE | INTRAMUSCULAR | Status: AC
  Filled 2023-05-14: qty 1

## 2023-05-14 MED ORDER — AMISULPRIDE (ANTIEMETIC) 5 MG/2ML IV SOLN: 10.0000 mg | Freq: Once | INTRAVENOUS | Status: DC | PRN

## 2023-05-14 MED ORDER — ONDANSETRON HCL 4 MG/2ML IJ SOLN
4.0000 mg | Freq: Four times a day (QID) | INTRAMUSCULAR | Status: DC | PRN
  Administered 2023-05-14 (×2): 4 mg via INTRAVENOUS
  Filled 2023-05-14 (×2): qty 2

## 2023-05-14 MED ORDER — MIDAZOLAM HCL 2 MG/2ML IJ SOLN
INTRAMUSCULAR | Status: AC
  Filled 2023-05-14: qty 2

## 2023-05-14 MED ORDER — HYDROMORPHONE HCL 1 MG/ML IJ SOLN
1.0000 mg | Freq: Once | INTRAMUSCULAR | Status: AC
  Administered 2023-05-14: 1 mg via INTRAVENOUS
  Filled 2023-05-14: qty 1

## 2023-05-14 MED ORDER — SODIUM CHLORIDE 0.9 % IR SOLN
Status: DC | PRN
  Administered 2023-05-14: 4000 mL via INTRAVESICAL

## 2023-05-14 MED ORDER — ONDANSETRON HCL 4 MG/2ML IJ SOLN
INTRAMUSCULAR | Status: AC
  Filled 2023-05-14: qty 2

## 2023-05-14 MED ORDER — KETOROLAC TROMETHAMINE 30 MG/ML IJ SOLN
15.0000 mg | Freq: Once | INTRAMUSCULAR | Status: AC | PRN
  Administered 2023-05-14: 15 mg via INTRAVENOUS

## 2023-05-14 MED ORDER — SUCCINYLCHOLINE CHLORIDE 200 MG/10ML IV SOSY
PREFILLED_SYRINGE | INTRAVENOUS | Status: DC | PRN
  Administered 2023-05-14: 100 mg via INTRAVENOUS

## 2023-05-14 MED ORDER — ORAL CARE MOUTH RINSE: 15.0000 mL | Freq: Once | OROMUCOSAL | Status: AC

## 2023-05-14 MED ORDER — MIDAZOLAM HCL 5 MG/5ML IJ SOLN
INTRAMUSCULAR | Status: DC | PRN
  Administered 2023-05-14: 1 mg via INTRAVENOUS

## 2023-05-14 MED ORDER — PROPOFOL 10 MG/ML IV BOLUS
INTRAVENOUS | Status: DC | PRN
  Administered 2023-05-14: 170 mg via INTRAVENOUS

## 2023-05-14 MED ORDER — FENTANYL CITRATE (PF) 100 MCG/2ML IJ SOLN
INTRAMUSCULAR | Status: AC
  Filled 2023-05-14: qty 2

## 2023-05-14 MED ORDER — FENTANYL CITRATE PF 50 MCG/ML IJ SOSY
25.0000 ug | PREFILLED_SYRINGE | INTRAMUSCULAR | Status: DC | PRN
  Administered 2023-05-14: 50 ug via INTRAVENOUS

## 2023-05-14 MED ORDER — SODIUM CHLORIDE 0.9 % IV SOLN: INTRAVENOUS | Status: AC

## 2023-05-14 MED ORDER — DEXAMETHASONE SODIUM PHOSPHATE 4 MG/ML IJ SOLN
INTRAMUSCULAR | Status: DC | PRN
  Administered 2023-05-14: 8 mg via INTRAVENOUS

## 2023-05-14 MED ORDER — FENTANYL CITRATE (PF) 100 MCG/2ML IJ SOLN
INTRAMUSCULAR | Status: DC | PRN
  Administered 2023-05-14: 50 ug via INTRAVENOUS

## 2023-05-14 SURGICAL SUPPLY — 21 items
BAG COUNTER SPONGE SURGICOUNT (BAG) IMPLANT
BAG URO CATCHER STRL LF (MISCELLANEOUS) ×1 IMPLANT
BASKET ZERO TIP NITINOL 2.4FR (BASKET) IMPLANT
CATH URETL OPEN END 6FR 70 (CATHETERS) IMPLANT
CLOTH BEACON ORANGE TIMEOUT ST (SAFETY) ×1 IMPLANT
COVER SURGICAL LIGHT HANDLE (MISCELLANEOUS) ×1 IMPLANT
GLOVE SURG LX STRL 7.5 STRW (GLOVE) ×1 IMPLANT
GOWN STRL REUS W/ TWL XL LVL3 (GOWN DISPOSABLE) ×1 IMPLANT
GUIDEWIRE STR DUAL SENSOR (WIRE) ×1 IMPLANT
GUIDEWIRE ZIPWRE .038 STRAIGHT (WIRE) IMPLANT
IV NS 1000ML BAXH (IV SOLUTION) ×1 IMPLANT
KIT TURNOVER KIT A (KITS) IMPLANT
LASER FIB FLEXIVA PULSE ID 365 (Laser) IMPLANT
MANIFOLD NEPTUNE II (INSTRUMENTS) ×1 IMPLANT
PACK CYSTO (CUSTOM PROCEDURE TRAY) ×1 IMPLANT
PAD PREP 24X48 CUFFED NSTRL (MISCELLANEOUS) ×1 IMPLANT
PENCIL SMOKE EVACUATOR (MISCELLANEOUS) IMPLANT
SHEATH NAVIGATOR HD 12/14X36 (SHEATH) IMPLANT
TRACTIP FLEXIVA PULS ID 200XHI (Laser) IMPLANT
TUBING CONNECTING 10 (TUBING) ×1 IMPLANT
TUBING UROLOGY SET (TUBING) ×1 IMPLANT

## 2023-05-14 NOTE — ED Notes (Signed)
Pt desat to 83% after dilaudid administration. Placed on 2L [place], back to 97%

## 2023-05-14 NOTE — Discharge Instructions (Addendum)
You may see some blood in the urine and may have some burning with urination for 48-72 hours. You also may notice that you have to urinate more frequently or urgently after your procedure which is normal.  You should call should you develop an inability urinate, fever > 101, persistent nausea and vomiting that prevents you from eating or drinking to stay hydrated.    

## 2023-05-14 NOTE — Consult Note (Signed)
Urology Consult   Physician requesting consult: Dr. Blinda Leatherwood  Reason for consult: Right ureteral calculus  History of Present Illness: Lindsey Powell is a 32 y.o. who developed the initial onset of right lower quadrant cramping and pain approximately 2 weeks ago.  This then progressed to more significant pain with radiation to her right flank last week.  This became associate with nausea and vomiting.  Her pain continued intermittently and progressed until she presented to the emergency department last night.  A CT scan was performed demonstrating a 2 mm right distal ureteral calculus.  Attempts to get her pain and nausea under control have been unsuccessful.  She has no prior history of urolithiasis.  She denies a history of voiding or storage urinary symptoms, hematuria, UTIs, STDs, urolithiasis, GU malignancy/trauma/surgery.  Past Medical History:  Diagnosis Date   Anxiety    Thyroid disease     Past Surgical History:  Procedure Laterality Date   ARTHROSCOPIC REPAIR ACL  2010   CESAREAN SECTION       Current Hospital Medications:  Home meds:  No current facility-administered medications on file prior to encounter.   Current Outpatient Medications on File Prior to Encounter  Medication Sig Dispense Refill   buPROPion (WELLBUTRIN XL) 300 MG 24 hr tablet TAKE 1 TABLET BY MOUTH EVERY DAY 90 tablet 1   levothyroxine (SYNTHROID) 25 MCG tablet Take 1 tablet (25 mcg total) by mouth daily before breakfast. HAVE LABS DONE IN 6 WEEKS!! 90 tablet 0   metFORMIN (GLUCOPHAGE-XR) 500 MG 24 hr tablet TAKE 2 TABLETS BY MOUTH EVERY DAY WITH BREAKFAST 180 tablet 0   ondansetron (ZOFRAN-ODT) 4 MG disintegrating tablet Take 1 tablet (4 mg total) by mouth every 8 (eight) hours as needed for nausea or vomiting. 10 tablet 0   oxyCODONE (OXY IR/ROXICODONE) 5 MG immediate release tablet Take 1 tablet (5 mg total) by mouth every 6 (six) hours as needed for severe pain (pain score 7-10). 8 tablet 0    sertraline (ZOLOFT) 25 MG tablet TAKE 1 TABLET (25 MG TOTAL) BY MOUTH DAILY. 90 tablet 1   spironolactone (ALDACTONE) 25 MG tablet Take 1 tablet (25 mg total) by mouth daily. 90 tablet 3     Scheduled Meds: Continuous Infusions:  sodium chloride 125 mL/hr at 05/14/23 0055   PRN Meds:.HYDROmorphone (DILAUDID) injection, ondansetron (ZOFRAN) IV  Allergies:  Allergies  Allergen Reactions   Minocycline Nausea And Vomiting   Benzoyl Peroxide          Family History  Problem Relation Age of Onset   Diabetes Mother    Alcohol abuse Mother    Diabetes Father    Alcohol abuse Father    Cancer Father    Hypertension Father    Hypertension Maternal Aunt    Alcohol abuse Paternal Uncle    Cancer Maternal Grandfather    Cancer Paternal Grandfather     Social History:  reports that she has never smoked. She has never used smokeless tobacco. She reports current alcohol use. She reports that she does not use drugs.  ROS: A complete review of systems was performed.  All systems are negative except for pertinent findings as noted.  Physical Exam:  Vital signs in last 24 hours: Temp:  [97.6 F (36.4 C)-98.8 F (37.1 C)] 98.3 F (36.8 C) (12/12 0621) Pulse Rate:  [60-120] 79 (12/12 0457) Resp:  [12-20] 18 (12/12 0457) BP: (101-119)/(52-90) 118/80 (12/12 0457) SpO2:  [93 %-100 %] 93 % (12/12 0457) Weight:  [71.2  kg] 71.2 kg (12/11 2055) Constitutional:  Alert and oriented, No acute distress Cardiovascular: Regular rate and rhythm, No JVD Respiratory: Normal respiratory effort, Lungs clear bilaterally GI: Abdomen is soft, nontender, nondistended, no abdominal masses GU: Mild right CVA tenderness Lymphatic: No lymphadenopathy Neurologic: Grossly intact, no focal deficits Psychiatric: Normal mood and affect  Laboratory Data:  Recent Labs    05/12/23 1504 05/13/23 2101  WBC 6.5 14.5*  HGB 12.5 13.5  HCT 37.2 39.7  PLT 226 246    Recent Labs    05/12/23 1441  05/13/23 2101  NA 138 138  K 4.1 3.8  CL 106 107  GLUCOSE 108* 123*  BUN 11 16  CALCIUM 9.1 9.3  CREATININE 1.08* 1.61*     Results for orders placed or performed during the hospital encounter of 05/13/23 (from the past 24 hours)  CBC with Differential     Status: Abnormal   Collection Time: 05/13/23  9:01 PM  Result Value Ref Range   WBC 14.5 (H) 4.0 - 10.5 K/uL   RBC 4.71 3.87 - 5.11 MIL/uL   Hemoglobin 13.5 12.0 - 15.0 g/dL   HCT 72.5 36.6 - 44.0 %   MCV 84.3 80.0 - 100.0 fL   MCH 28.7 26.0 - 34.0 pg   MCHC 34.0 30.0 - 36.0 g/dL   RDW 34.7 42.5 - 95.6 %   Platelets 246 150 - 400 K/uL   nRBC 0.0 0.0 - 0.2 %   Neutrophils Relative % 85 %   Neutro Abs 12.2 (H) 1.7 - 7.7 K/uL   Lymphocytes Relative 10 %   Lymphs Abs 1.5 0.7 - 4.0 K/uL   Monocytes Relative 5 %   Monocytes Absolute 0.8 0.1 - 1.0 K/uL   Eosinophils Relative 0 %   Eosinophils Absolute 0.0 0.0 - 0.5 K/uL   Basophils Relative 0 %   Basophils Absolute 0.0 0.0 - 0.1 K/uL   Immature Granulocytes 0 %   Abs Immature Granulocytes 0.04 0.00 - 0.07 K/uL  Comprehensive metabolic panel     Status: Abnormal   Collection Time: 05/13/23  9:01 PM  Result Value Ref Range   Sodium 138 135 - 145 mmol/L   Potassium 3.8 3.5 - 5.1 mmol/L   Chloride 107 98 - 111 mmol/L   CO2 19 (L) 22 - 32 mmol/L   Glucose, Bld 123 (H) 70 - 99 mg/dL   BUN 16 6 - 20 mg/dL   Creatinine, Ser 3.87 (H) 0.44 - 1.00 mg/dL   Calcium 9.3 8.9 - 56.4 mg/dL   Total Protein 7.9 6.5 - 8.1 g/dL   Albumin 4.9 3.5 - 5.0 g/dL   AST 19 15 - 41 U/L   ALT 10 0 - 44 U/L   Alkaline Phosphatase 62 38 - 126 U/L   Total Bilirubin 0.9 <1.2 mg/dL   GFR, Estimated 44 (L) >60 mL/min   Anion gap 12 5 - 15  Urinalysis, Routine w reflex microscopic -Urine, Clean Catch     Status: Abnormal   Collection Time: 05/13/23  9:01 PM  Result Value Ref Range   Color, Urine YELLOW YELLOW   APPearance CLOUDY (A) CLEAR   Specific Gravity, Urine 1.020 1.005 - 1.030   pH 8.5 (H)  5.0 - 8.0   Glucose, UA NEGATIVE NEGATIVE mg/dL   Hgb urine dipstick TRACE (A) NEGATIVE   Bilirubin Urine NEGATIVE NEGATIVE   Ketones, ur >=80 (A) NEGATIVE mg/dL   Protein, ur NEGATIVE NEGATIVE mg/dL   Nitrite NEGATIVE NEGATIVE  Leukocytes,Ua NEGATIVE NEGATIVE  Pregnancy, urine     Status: None   Collection Time: 05/13/23  9:01 PM  Result Value Ref Range   Preg Test, Ur NEGATIVE NEGATIVE  Urinalysis, Microscopic (reflex)     Status: Abnormal   Collection Time: 05/13/23  9:01 PM  Result Value Ref Range   RBC / HPF 0-5 0 - 5 RBC/hpf   WBC, UA 0-5 0 - 5 WBC/hpf   Bacteria, UA FEW (A) NONE SEEN   Squamous Epithelial / HPF 0-5 0 - 5 /HPF   Amorphous Crystal PRESENT    No results found for this or any previous visit (from the past 240 hours).  Renal Function: Recent Labs    05/12/23 1441 05/13/23 2101  CREATININE 1.08* 1.61*   Estimated Creatinine Clearance: 49.4 mL/min (A) (by C-G formula based on SCr of 1.61 mg/dL (H)).  Radiologic Imaging: CT ABDOMEN PELVIS W CONTRAST Result Date: 05/13/2023 CLINICAL DATA:  Right lower quadrant pain. Follow-up right-sided kidney stone from yesterday. EXAM: CT ABDOMEN AND PELVIS WITH CONTRAST TECHNIQUE: Multidetector CT imaging of the abdomen and pelvis was performed using the standard protocol following bolus administration of intravenous contrast. RADIATION DOSE REDUCTION: This exam was performed according to the departmental dose-optimization program which includes automated exposure control, adjustment of the mA and/or kV according to patient size and/or use of iterative reconstruction technique. CONTRAST:  80mL OMNIPAQUE IOHEXOL 300 MG/ML  SOLN COMPARISON:  CT without contrast yesterday at 3:27 p.m. FINDINGS: Lower chest: No abnormality. Hepatobiliary: There is a nonspecific 5 mm hypodensity in hepatic segment 4A on 301:16, and a 4 mm nonspecific hypodensity inferiorly in hepatic segment 5 on 301:28. Both of these are too small to characterize.  There is no hepatic mass enhancement. Mild periligamentous focal fat is present in segment 4B. The gallbladder and bile ducts are unremarkable. Pancreas: No abnormality. Spleen: Slightly prominent spleen 13.3 cm in length without mass enhancement. Adrenals/Urinary Tract: No adrenal or renal mass enhancement. On the left there are punctate up to 1 mm nonobstructive caliceal stones in the lower pole. On the right, there is interval worsening now moderate hydroureteronephrosis, with cortical contrast retention due to again noted 2 mm stone lodged in the UVJ, was previously in the most distal ureter. Please correlate clinically for infectious complication. There is minimal new right perinephric fluid which could be due to the obstructive uropathy or a caliceal leak. The bladder is unremarkable for the degree of distention. Stomach/Bowel: No dilatation or wall thickening including the appendix. Vascular/Lymphatic: No significant vascular findings are present. No enlarged abdominal or pelvic lymph nodes. Reproductive: Uterus and bilateral adnexa are unremarkable. Other: No abdominal wall hernia or abnormality. Trace low-density fluid in the cul-de-sac is unchanged, probably physiologic given age. There is no free hemorrhage, free air or abscess. Musculoskeletal: There is a healed fracture deformity of the left L5 pars. No acute or other significant osseous findings. IMPRESSION: 1. Interval worsening now moderate right hydroureteronephrosis due to 2 mm stone lodged in the UVJ, was previously in the most distal ureter. Correlate clinically for infectious complication. 2. Minimal new right perinephric fluid which could be due to the obstructive uropathy or a caliceal leak. 3. Nonobstructive left nephrolithiasis. 4. Two nonspecific hypodensities in the liver measuring up to 5 mm, too small to characterize. No follow-up imaging is recommended if the patient is low risk. In a high-risk patient, follow-up MRI 6-12 months without  and with contrast would be warranted. 5. Slightly prominent spleen. 6. Trace low-density fluid  in the cul-de-sac, unchanged, probably physiologic given age. 7. Healed fracture deformity of the left L5 pars. Electronically Signed   By: Almira Bar M.D.   On: 05/13/2023 22:49   CT Renal Stone Study Result Date: 05/12/2023 CLINICAL DATA:  Right flank and lower quadrant abdominal pain for the past week. EXAM: CT ABDOMEN AND PELVIS WITHOUT CONTRAST TECHNIQUE: Multidetector CT imaging of the abdomen and pelvis was performed following the standard protocol without IV contrast. RADIATION DOSE REDUCTION: This exam was performed according to the departmental dose-optimization program which includes automated exposure control, adjustment of the mA and/or kV according to patient size and/or use of iterative reconstruction technique. COMPARISON:  None Available. FINDINGS: Lower chest: No acute abnormality. Hepatobiliary: No focal liver abnormality is seen. No gallstones, gallbladder wall thickening, or biliary dilatation. Pancreas: Unremarkable. No pancreatic ductal dilatation or surrounding inflammatory changes. Spleen: Normal in size without focal abnormality. Adrenals/Urinary Tract: The adrenal glands are unremarkable. 2 mm calculus in the midpole of the left kidney. 2 mm calculus in the distal right ureter near the UVJ with resultant mild right hydroureteronephrosis. The bladder is unremarkable. Stomach/Bowel: Stomach is within normal limits. Appendix appears normal. No evidence of bowel wall thickening, distention, or inflammatory changes. Vascular/Lymphatic: No significant vascular findings are present. No enlarged abdominal or pelvic lymph nodes. Reproductive: Uterus and bilateral adnexa are unremarkable. Other: No abdominal wall hernia or abnormality. No abdominopelvic ascites. No pneumoperitoneum. Musculoskeletal: No acute or significant osseous findings. IMPRESSION: 1. 2 mm calculus in the distal right ureter  near the UVJ with resultant mild right hydroureteronephrosis. 2. Additional 2 mm nonobstructive left renal calculus. Electronically Signed   By: Obie Dredge M.D.   On: 05/12/2023 16:29    I independently reviewed the above imaging studies.  Impression/Recommendation: 2 mm right ureteral calculus: Her pain continues to be poorly controlled and she continues to have persistent nausea and vomiting.  As such, we have discussed proceeding with surgical intervention and cystoscopy/right retrograde pyelography/right ureteroscopy with stone removal and possible stent placement.  I have reviewed the potential risks, complications, and expected recovery process.  She gives informed consent to proceed.  Crecencio Mc 05/14/2023, 6:32 AM  Moody Bruins. MD   CC: Dr. Blinda Leatherwood

## 2023-05-14 NOTE — Anesthesia Preprocedure Evaluation (Addendum)
Anesthesia Evaluation  Patient identified by MRN, date of birth, ID band Patient awake    Reviewed: Allergy & Precautions, NPO status , Patient's Chart, lab work & pertinent test results  Airway Mallampati: II  TM Distance: >3 FB Neck ROM: Full    Dental no notable dental hx.    Pulmonary neg pulmonary ROS   Pulmonary exam normal        Cardiovascular negative cardio ROS Normal cardiovascular exam     Neuro/Psych  PSYCHIATRIC DISORDERS Anxiety Depression    negative neurological ROS     GI/Hepatic negative GI ROS, Neg liver ROS,,,  Endo/Other  Oral Hypoglycemic AgentsHypothyroidism  PRE-DM  Renal/GU Renal disease     Musculoskeletal negative musculoskeletal ROS (+)    Abdominal   Peds  Hematology negative hematology ROS (+)   Anesthesia Other Findings RIGHT URETERAL STONE  Reproductive/Obstetrics Hcg negative                             Anesthesia Physical Anesthesia Plan  ASA: 2  Anesthesia Plan: General   Post-op Pain Management:    Induction: Intravenous  PONV Risk Score and Plan: 4 or greater and Ondansetron, Dexamethasone, Midazolam, Treatment may vary due to age or medical condition and Propofol infusion  Airway Management Planned: Oral ETT  Additional Equipment:   Intra-op Plan:   Post-operative Plan: Extubation in OR  Informed Consent: I have reviewed the patients History and Physical, chart, labs and discussed the procedure including the risks, benefits and alternatives for the proposed anesthesia with the patient or authorized representative who has indicated his/her understanding and acceptance.     Dental advisory given  Plan Discussed with: CRNA  Anesthesia Plan Comments:        Anesthesia Quick Evaluation

## 2023-05-14 NOTE — Anesthesia Procedure Notes (Signed)
Procedure Name: Intubation Date/Time: 05/14/2023 10:03 AM  Performed by: Dennison Nancy, CRNAPre-anesthesia Checklist: Patient identified, Emergency Drugs available, Suction available, Patient being monitored and Timeout performed Patient Re-evaluated:Patient Re-evaluated prior to induction Oxygen Delivery Method: Circle system utilized Preoxygenation: Pre-oxygenation with 100% oxygen Induction Type: IV induction and Cricoid Pressure applied Ventilation: Mask ventilation without difficulty Laryngoscope Size: Mac and 3 Grade View: Grade I Tube type: Oral Tube size: 7.0 mm Number of attempts: 1 Airway Equipment and Method: Stylet Placement Confirmation: ETT inserted through vocal cords under direct vision, positive ETCO2, CO2 detector and breath sounds checked- equal and bilateral Secured at: 22 cm Tube secured with: Tape Dental Injury: Teeth and Oropharynx as per pre-operative assessment

## 2023-05-14 NOTE — ED Notes (Signed)
[facility] on unit 

## 2023-05-14 NOTE — Op Note (Signed)
Preoperative diagnosis: Right ureteral calculus  Postoperative diagnosis: Right ureteral calculus  Procedure:  Cystoscopy Right ureteroscopy and stone removal Right retrograde pyelography with interpretation  Surgeon: Moody Bruins. M.D.  Anesthesia: General  Complications: None  Intraoperative findings impression: Right retrograde pyelography demonstrated a filling defect within the distal right ureter consistent with the patient's known calculus without other abnormalities.  EBL: Minimal  Specimens: Right ureteral calculus  Disposition of specimens: [facility] for stone analysis  Indication: Lindsey Powell is a 32 y.o. year old patient with urolithiasis. She presented with a symptomatic 2 mm distal right ureteral stone with uncontrolled pain. After reviewing the management options for treatment, the patient elected to proceed with the above surgical procedure(s). We have discussed the potential benefits and risks of the procedure, side effects of the proposed treatment, the likelihood of the patient achieving the goals of the procedure, and any potential problems that might occur during the procedure or recuperation. Informed consent has been obtained.  Description of procedure:  The patient was taken to the operating room and general anesthesia was induced.  The patient was placed in the dorsal lithotomy position, prepped and draped in the usual sterile fashion, and preoperative antibiotics were administered. A preoperative time-out was performed.   Cystourethroscopy was performed.  The patient's urethra was examined and was normal. The bladder was then systematically examined in its entirety. There was no evidence for any bladder tumors, stones, or other mucosal pathology.    Attention then turned to the right ureteral orifice and a ureteral catheter was used to intubate the ureteral orifice.  Omnipaque contrast was injected through the ureteral catheter  and a retrograde pyelogram was performed with findings as dictated above.  A 0.38 sensor guidewire was then advanced up the right ureter into the renal pelvis under fluoroscopic guidance. The 6 Fr semirigid ureteroscope was then advanced into the ureter next to the guidewire and the calculus was identified.  The stone were then removed from the ureter with a zero tip nitinol basket.   No stent was needed.  The bladder was then emptied and the procedure ended.  The patient appeared to tolerate the procedure well and without complications.  The patient was able to be awakened and transferred to the recovery unit in satisfactory condition.

## 2023-05-14 NOTE — Transfer of Care (Signed)
Immediate Anesthesia Transfer of Care Note  Patient: Lindsey Powell  Procedure(s) Performed: CYSTOSCOPY WITH RETROGRADE PYELOGRAM, URETEROSCOPY (Right) POSSIBLE HOLMIUM LASER APPLICATION (Right)  Patient Location: PACU  Anesthesia Type:General  Level of Consciousness: awake, alert , oriented, and patient cooperative  Airway & Oxygen Therapy: Patient Spontanous Breathing and Patient connected to face mask oxygen  Post-op Assessment: Report given to RN and Post -op Vital signs reviewed and stable  Post vital signs: Reviewed and stable  Last Vitals:  Vitals Value Taken Time  BP 119/78 05/14/23 1045  Temp 37.1 C 05/14/23 1042  Pulse 79 05/14/23 1046  Resp 18 05/14/23 1046  SpO2 100 % 05/14/23 1046  Vitals shown include unfiled device data.  Last Pain:  Vitals:   05/14/23 1042  TempSrc:   PainSc: 0-No pain         Complications: No notable events documented.

## 2023-05-14 NOTE — ED Notes (Signed)
Pt off unit at this time for transport to [facility]

## 2023-05-15 ENCOUNTER — Encounter (HOSPITAL_COMMUNITY): Payer: Self-pay | Admitting: Urology

## 2023-05-15 LAB — URINE CULTURE: Culture: <10000 — AB

## 2023-05-15 NOTE — Anesthesia Postprocedure Evaluation (Signed)
Anesthesia Post Note  Patient: Lindsey Powell  Procedure(s) Performed: CYSTOSCOPY WITH RETROGRADE PYELOGRAM, URETEROSCOPY (Right) POSSIBLE HOLMIUM LASER APPLICATION (Right)     Patient location during evaluation: PACU Anesthesia Type: General Level of consciousness: awake Pain management: pain level controlled Vital Signs Assessment: post-procedure vital signs reviewed and stable Respiratory status: spontaneous breathing, nonlabored ventilation and respiratory function stable Cardiovascular status: blood pressure returned to baseline and stable Postop Assessment: no apparent nausea or vomiting Anesthetic complications: no   No notable events documented.  Last Vitals:  Vitals:   05/14/23 1156 05/14/23 1203  BP: 112/80 122/86  Pulse: 70 77  Resp:  16  Temp:  36.9 C  SpO2: 92% 94%    Last Pain:  Vitals:   05/14/23 1203  TempSrc:   PainSc: 2                  Lindsey Powell

## 2023-05-16 ENCOUNTER — Emergency Department (HOSPITAL_COMMUNITY): Payer: BC Managed Care – PPO

## 2023-05-16 ENCOUNTER — Other Ambulatory Visit: Payer: Self-pay

## 2023-05-16 ENCOUNTER — Emergency Department (HOSPITAL_COMMUNITY)
Admission: EM | Admit: 2023-05-16 | Discharge: 2023-05-16 | Disposition: A | Discharge status: Home / Self Care | Payer: BC Managed Care – PPO

## 2023-05-16 ENCOUNTER — Encounter (HOSPITAL_COMMUNITY): Payer: Self-pay

## 2023-05-16 DIAGNOSIS — R1031 Right lower quadrant pain: Secondary | ICD-10-CM | POA: Insufficient documentation

## 2023-05-16 DIAGNOSIS — R112 Nausea with vomiting, unspecified: Secondary | ICD-10-CM | POA: Insufficient documentation

## 2023-05-16 DIAGNOSIS — R10A0 Flank pain, unspecified side: Secondary | ICD-10-CM

## 2023-05-16 DIAGNOSIS — G8918 Other acute postprocedural pain: Secondary | ICD-10-CM

## 2023-05-16 DIAGNOSIS — R6883 Chills (without fever): Secondary | ICD-10-CM | POA: Insufficient documentation

## 2023-05-16 DIAGNOSIS — R109 Unspecified abdominal pain: Secondary | ICD-10-CM

## 2023-05-16 DIAGNOSIS — N132 Hydronephrosis with renal and ureteral calculous obstruction: Secondary | ICD-10-CM | POA: Diagnosis not present

## 2023-05-16 DIAGNOSIS — N3289 Other specified disorders of bladder: Secondary | ICD-10-CM | POA: Diagnosis not present

## 2023-05-16 LAB — CBC WITH DIFFERENTIAL/PLATELET
Abs Immature Granulocytes: 0.03 10*3/uL (ref 0.00–0.07)
Basophils Absolute: 0 10*3/uL (ref 0.0–0.1)
Basophils Relative: 0 %
Eosinophils Absolute: 0.1 10*3/uL (ref 0.0–0.5)
Eosinophils Relative: 1 %
HCT: 39.7 % (ref 36.0–46.0)
Hemoglobin: 13.3 g/dL (ref 12.0–15.0)
Immature Granulocytes: 0 %
Lymphocytes Relative: 24 %
Lymphs Abs: 2.1 10*3/uL (ref 0.7–4.0)
MCH: 29.2 pg (ref 26.0–34.0)
MCHC: 33.5 g/dL (ref 30.0–36.0)
MCV: 87.3 fL (ref 80.0–100.0)
Monocytes Absolute: 0.7 10*3/uL (ref 0.1–1.0)
Monocytes Relative: 8 %
Neutro Abs: 6 10*3/uL (ref 1.7–7.7)
Neutrophils Relative %: 67 %
Platelets: 214 10*3/uL (ref 150–400)
RBC: 4.55 MIL/uL (ref 3.87–5.11)
RDW: 13.1 % (ref 11.5–15.5)
WBC: 9 10*3/uL (ref 4.0–10.5)
nRBC: 0 % (ref 0.0–0.2)

## 2023-05-16 LAB — COMPREHENSIVE METABOLIC PANEL
ALT: 9 U/L (ref 0–44)
AST: 15 U/L (ref 15–41)
Albumin: 4.5 g/dL (ref 3.5–5.0)
Alkaline Phosphatase: 57 U/L (ref 38–126)
Anion gap: 7 (ref 5–15)
BUN: 15 mg/dL (ref 6–20)
CO2: 23 mmol/L (ref 22–32)
Calcium: 9 mg/dL (ref 8.9–10.3)
Chloride: 105 mmol/L (ref 98–111)
Creatinine, Ser: 1.09 mg/dL — ABNORMAL HIGH (ref 0.44–1.00)
GFR, Estimated: >60 mL/min (ref >60)
Glucose, Bld: 90 mg/dL (ref 70–99)
Potassium: 3.3 mmol/L — ABNORMAL LOW (ref 3.5–5.1)
Sodium: 135 mmol/L (ref 135–145)
Total Bilirubin: 0.7 mg/dL (ref <1.2)
Total Protein: 7.8 g/dL (ref 6.5–8.1)

## 2023-05-16 LAB — URINALYSIS, ROUTINE W REFLEX MICROSCOPIC
Bilirubin Urine: NEGATIVE
Glucose, UA: NEGATIVE mg/dL
Ketones, ur: 5 mg/dL — AB
Nitrite: NEGATIVE
Protein, ur: 30 mg/dL — AB
Specific Gravity, Urine: 1.006 (ref 1.005–1.030)
pH: 6 (ref 5.0–8.0)

## 2023-05-16 LAB — LIPASE, BLOOD: Lipase: 31 U/L (ref 11–51)

## 2023-05-16 LAB — HCG, SERUM, QUALITATIVE: Preg, Serum: NEGATIVE

## 2023-05-16 MED ORDER — HYDROMORPHONE HCL 2 MG PO TABS: 1.0000 mg | ORAL_TABLET | ORAL | 0 refills | Status: DC | PRN

## 2023-05-16 MED ORDER — SODIUM CHLORIDE 0.9 % IV SOLN
1.0000 g | Freq: Once | INTRAVENOUS | Status: AC
  Administered 2023-05-16: 1 g via INTRAVENOUS
  Filled 2023-05-16: qty 10

## 2023-05-16 MED ORDER — SODIUM CHLORIDE 0.9 % IV BOLUS
1000.0000 mL | Freq: Once | INTRAVENOUS | Status: AC
  Administered 2023-05-16: 1000 mL via INTRAVENOUS

## 2023-05-16 MED ORDER — KETOROLAC TROMETHAMINE 15 MG/ML IJ SOLN: 15.0000 mg | Freq: Once | INTRAMUSCULAR | Status: DC

## 2023-05-16 MED ORDER — MORPHINE SULFATE (PF) 2 MG/ML IV SOLN
2.0000 mg | Freq: Once | INTRAVENOUS | Status: AC
Start: 2023-05-16 — End: 2023-05-16
  Administered 2023-05-16: 2 mg via INTRAVENOUS
  Filled 2023-05-16: qty 1

## 2023-05-16 MED ORDER — CEFPODOXIME PROXETIL 100 MG PO TABS: 100.0000 mg | ORAL_TABLET | Freq: Two times a day (BID) | ORAL | 0 refills | Status: DC

## 2023-05-16 MED ORDER — IOHEXOL 300 MG/ML  SOLN
100.0000 mL | Freq: Once | INTRAMUSCULAR | Status: AC | PRN
  Administered 2023-05-16: 100 mL via INTRAVENOUS

## 2023-05-16 MED ORDER — HYDROMORPHONE HCL 1 MG/ML IJ SOLN
0.5000 mg | Freq: Once | INTRAMUSCULAR | Status: AC
  Administered 2023-05-16: 0.5 mg via INTRAVENOUS
  Filled 2023-05-16: qty 1

## 2023-05-16 MED ORDER — ONDANSETRON HCL 4 MG/2ML IJ SOLN
4.0000 mg | Freq: Once | INTRAMUSCULAR | Status: AC
  Administered 2023-05-16: 4 mg via INTRAVENOUS
  Filled 2023-05-16: qty 2

## 2023-05-16 NOTE — ED Triage Notes (Signed)
Pt reports surgery on 12/12 for kidney stones.  C/o RLQ abd pain with n/v chills. Pain increases on palpitation.  LBM: prior to surgery. Pt reports was discharged home on oxycodone and hasn't been taking any stool softeners.

## 2023-05-16 NOTE — ED Provider Notes (Signed)
[place] EMERGENCY DEPARTMENT AT [facility] Provider Note   CSN: [id number] Arrival date & time: 05/16/23  1336     History  Chief Complaint  Patient presents with   Abdominal Pain   HPI Lindsey Powell is a 32 y.o. female with recent kidney stone status post cystoscopy with retrograde pyelogram ureteroscopy and stent placement on December 12 presenting for right flank pain.  Started acutely about 3 hours ago but has improved considerably pain was initially 10/10 but now 2/10.  It is primarily in the right flank and radiates to the right mid abdomen.  Endorses 2 occurrences of vomiting and chills but denies fever.  Last bowel movement was Wednesday before the procedure.  Patient is taking oxycodone at home for pain and is not on stool softener.   Abdominal Pain      Home Medications Prior to Admission medications   Medication Sig Start Date End Date Taking? Authorizing Provider  buPROPion (WELLBUTRIN XL) 300 MG 24 hr tablet TAKE 1 TABLET BY MOUTH EVERY DAY 01/02/23  Yes Christen Butter, NP  levothyroxine (SYNTHROID) 25 MCG tablet Take 1 tablet (25 mcg total) by mouth daily before breakfast. HAVE LABS DONE IN 6 WEEKS!! 03/18/23  Yes Christen Butter, NP  metFORMIN (GLUCOPHAGE-XR) 500 MG 24 hr tablet TAKE 2 TABLETS BY MOUTH EVERY DAY WITH BREAKFAST 03/02/23  Yes Christen Butter, NP  ondansetron (ZOFRAN-ODT) 4 MG disintegrating tablet Take 1 tablet (4 mg total) by mouth every 8 (eight) hours as needed for nausea or vomiting. 05/12/23  Yes Renne Crigler, PA-C  oxyCODONE (ROXICODONE) 5 MG immediate release tablet Take 1 tablet (5 mg total) by mouth every 4 (four) hours as needed for severe pain (pain score 7-10). 05/14/23  Yes Heloise Purpura, MD  sertraline (ZOLOFT) 25 MG tablet TAKE 1 TABLET (25 MG TOTAL) BY MOUTH DAILY. 01/20/23  Yes Christen Butter, NP  spironolactone (ALDACTONE) 25 MG tablet Take 1 tablet (25 mg total) by mouth daily. 03/17/23  Yes Christen Butter, NP  oxyCODONE (OXY  IR/ROXICODONE) 5 MG immediate release tablet Take 1 tablet (5 mg total) by mouth every 6 (six) hours as needed for severe pain (pain score 7-10). Patient not taking: Reported on 05/16/2023 05/12/23   Renne Crigler, PA-C      Allergies    Minocycline and Benzoyl peroxide    Review of Systems   Review of Systems  Gastrointestinal:  Positive for abdominal pain.    Physical Exam Updated Vital Signs BP 114/75 (BP Location: Right Arm)   Pulse 73   Temp 98.3 F (36.8 C) (Oral)   Resp 16   Ht 5\' 4"  (1.626 m)   Wt 71 kg   LMP 05/08/2023 (Exact Date)   SpO2 97%   BMI 26.87 kg/m  Physical Exam Vitals and nursing note reviewed.  HENT:     Head: Normocephalic and atraumatic.     Mouth/Throat:     Mouth: Mucous membranes are moist.  Eyes:     General:        Right eye: No discharge.        Left eye: No discharge.     Conjunctiva/sclera: Conjunctivae normal.  Cardiovascular:     Rate and Rhythm: Normal rate and regular rhythm.     Pulses: Normal pulses.     Heart sounds: Normal heart sounds.  Pulmonary:     Effort: Pulmonary effort is normal.     Breath sounds: Normal breath sounds.  Abdominal:     General: Abdomen  is flat.     Palpations: Abdomen is soft.     Tenderness: There is right CVA tenderness.  Skin:    General: Skin is warm and dry.  Neurological:     General: No focal deficit present.  Psychiatric:        Mood and Affect: Mood normal.     ED Results / Procedures / Treatments   Labs (all labs ordered are listed, but only abnormal results are displayed) Labs Reviewed  CBC WITH DIFFERENTIAL/PLATELET  COMPREHENSIVE METABOLIC PANEL  LIPASE, BLOOD  URINALYSIS, ROUTINE W REFLEX MICROSCOPIC  HCG, SERUM, QUALITATIVE    EKG None  Radiology No results found.  Procedures Procedures    Medications Ordered in ED Medications  sodium chloride 0.9 % bolus 1,000 mL (1,000 mLs Intravenous New Bag/Given 05/16/23 1508)  ondansetron (ZOFRAN) injection 4 mg (4 mg  Intravenous Given 05/16/23 1505)  morphine (PF) 2 MG/ML injection 2 mg (2 mg Intravenous Given 05/16/23 1505)    ED Course/ Medical Decision Making/ A&P Clinical Course as of 05/16/23 1515  Sat May 16, 2023  1509 Recent kidney stone, retrograde pyelogram. Stent was placed on R. Patient denies this. 2 days ago is when stent was supposedly placed. Back with R flank pain. Onset of acute pain 4 hours ago. Initially 10/10 now 2/10. Ordered another renal stone study.  [CG]    Clinical Course User Index [CG] Clent Ridges                                 Medical Decision Making  32 year old well-appearing female presenting for right flank pain in setting of recent kidney stone status post cystoscopy with retrograde pyelogram ureteroscopy and stent placement on December 12. Exam notable for right flank tenderness but otherwise reassuring. DDx includes postop complication, postop infection, kidney stone pyelonephritis, other.  Abdominal labs, UA and CT renal stone is pending.  Signed out to PA [employer]  Plan will be to reassess pain, follow-up on CT scan and pending labs, if unremarkable can be discharged with urology follow-up but may need to consult urology if CT scan is abnormal.        Final Clinical Impression(s) / ED Diagnoses Final diagnoses:  Flank pain    Rx / DC Orders ED Discharge Orders     None         Gareth Eagle, PA-C 05/16/23 1518    Durwin Glaze, MD 05/17/23 [phone]

## 2023-05-16 NOTE — ED Provider Notes (Signed)
Physical Exam  BP (!) 116/91 (BP Location: Right Arm)   Pulse 62   Temp 98.4 F (36.9 C) (Oral)   Resp 16   Ht 5\' 4"  (1.626 m)   Wt 71 kg   LMP 05/08/2023 (Exact Date)   SpO2 98%   BMI 26.87 kg/m   Physical Exam Vitals and nursing note reviewed.  Constitutional:      General: She is not in acute distress.    Appearance: She is well-developed.  HENT:     Head: Normocephalic and atraumatic.  Eyes:     Conjunctiva/sclera: Conjunctivae normal.  Cardiovascular:     Rate and Rhythm: Normal rate and regular rhythm.     Heart sounds: No murmur heard. Pulmonary:     Effort: Pulmonary effort is normal. No respiratory distress.     Breath sounds: Normal breath sounds.  Abdominal:     Palpations: Abdomen is soft.     Tenderness: There is no abdominal tenderness. There is right CVA tenderness.  Musculoskeletal:        General: No swelling.     Cervical back: Neck supple.  Skin:    General: Skin is warm and dry.     Capillary Refill: Capillary refill takes less than 2 seconds.  Neurological:     Mental Status: She is alert.  Psychiatric:        Mood and Affect: Mood normal.     Procedures  Procedures  ED Course / MDM   Clinical Course as of 05/16/23 2032  Sat May 16, 2023  1509 Recent kidney stone, retrograde pyelogram. Stent was placed on R. Patient denies this. 2 days ago is when stent was supposedly placed. Back with R flank pain. Onset of acute pain 4 hours ago. Initially 10/10 now 2/10. Ordered another renal stone study.  [CG]  1813 Making sure they're getting contrast down to bladder on right side. [CG]    Clinical Course User Index [CG] Al Decant, PA-C   Medical Decision Making Amount and/or Complexity of Data Reviewed Labs: ordered. Radiology: ordered.  Risk Prescription drug management.   32 year old female signed out to me at shift change pending reassessment, CT renal stone study.  Please see previous provider note for further details.  In  short, 32 year old female who had recent Cystourethroscopy with Dr. Laverle Patter urology on 12/12.  Patient returns today with right-sided flank pain acute onset beginning about 4 hours prior to arrival.  Patient stated her pain was initially 10 out of 10 but after 2 mg of morphine pain did decrease to 2 out of 10.  Patient signed out to me pending CT renal stone study and reassessment.  Patient advised me that her pain is increased so provided 1 mg of Dilaudid with good effect.  The patient CT renal stone study is inconclusive.  Will proceed with CT abdomen pelvis with contrast to assess for extravasation.  Extravasation not noted on CT abdomen pelvis with contrast.  Patient most likely has pyelonephritis based on imaging, urinalysis.  Will culture urine, will give patient 1 g Rocephin.  Will send patient with Vantin, increased pain control.  Patient advised to follow-up with urology as an outpatient.  She voiced understanding.  Patient was offered pain control admission but she is deferring on this at this time.  She has no white count, has had no nausea or vomiting while she is been here, states her pain is more well-controlled at this time.  Return precautions were provided and she voiced understanding.  Stable to discharge home.      Al Decant, PA-C 05/16/23 2033    Coral Spikes, DO 05/16/23 2145

## 2023-05-16 NOTE — Discharge Instructions (Signed)
Please begin taking Vantin 100 mg 2 times a day for the next 7 days.  Please also pick up new medication sent in for pain.  Please only take 0.5 tablets at a time, 1 mg every 4 hours as needed for pain.  Do not mix with oxycodone.  Do not notes with alcohol.  Please follow-up with urology this week.  If he began having nausea, vomiting or uncontrolled pain before seeing urology please return to the ED for further management and care.

## 2023-05-16 NOTE — ED Notes (Signed)
IV established, light green and lavender labeled and sent to lab.

## 2023-05-18 DIAGNOSIS — N201 Calculus of ureter: Secondary | ICD-10-CM | POA: Diagnosis not present

## 2023-05-18 LAB — URINE CULTURE: Culture: <10000 — AB

## 2023-06-04 ENCOUNTER — Telehealth: Payer: Self-pay

## 2023-06-04 NOTE — Progress Notes (Signed)
 Transition Care Management Unsuccessful Follow-up Telephone Call  Date of discharge and from where:  [facility]sey [facility] 12/14  Attempts:  1st Attempt  Reason for unsuccessful TCM follow-up call:  No answer/busy   Jon Colt [place]  [facility], [facility] Guide, Phone: [phone] Website: [email or web address]

## 2023-06-05 ENCOUNTER — Telehealth: Payer: Self-pay

## 2023-06-05 NOTE — Progress Notes (Signed)
 Transition Care Management Unsuccessful Follow-up Telephone Call  Date of discharge and from where:  [facility] 12/14  Attempts:  2nd Attempt  Reason for unsuccessful TCM follow-up call:  No answer/busy   Jon Colt [place]  [facility], [facility] Guide, Phone: [phone] Website: [email or web address]

## 2023-07-17 ENCOUNTER — Other Ambulatory Visit: Payer: Self-pay | Admitting: Medical-Surgical

## 2023-07-17 DIAGNOSIS — F419 Anxiety disorder, unspecified: Secondary | ICD-10-CM

## 2023-07-17 DIAGNOSIS — F339 Major depressive disorder, recurrent, unspecified: Secondary | ICD-10-CM

## 2023-09-04 ENCOUNTER — Encounter: Payer: Self-pay | Admitting: Medical-Surgical

## 2023-09-04 ENCOUNTER — Ambulatory Visit (INDEPENDENT_AMBULATORY_CARE_PROVIDER_SITE_OTHER): Admitting: Medical-Surgical

## 2023-09-04 VITALS — BP 100/64 | HR 68 | Resp 20 | Ht 64.0 in | Wt 152.2 lb

## 2023-09-04 DIAGNOSIS — E063 Autoimmune thyroiditis: Secondary | ICD-10-CM

## 2023-09-04 DIAGNOSIS — R7303 Prediabetes: Secondary | ICD-10-CM

## 2023-09-04 DIAGNOSIS — F339 Major depressive disorder, recurrent, unspecified: Secondary | ICD-10-CM

## 2023-09-04 DIAGNOSIS — F419 Anxiety disorder, unspecified: Secondary | ICD-10-CM | POA: Diagnosis not present

## 2023-09-04 DIAGNOSIS — K5909 Other constipation: Secondary | ICD-10-CM

## 2023-09-04 MED ORDER — METFORMIN HCL ER 500 MG PO TB24
ORAL_TABLET | ORAL | 1 refills | Status: DC
Start: 2023-09-04 — End: 2024-03-11

## 2023-09-04 MED ORDER — SERTRALINE HCL 25 MG PO TABS: 25.0000 mg | ORAL_TABLET | Freq: Every day | ORAL | 1 refills | Status: DC

## 2023-09-04 MED ORDER — LEVOTHYROXINE SODIUM 25 MCG PO TABS
25.0000 ug | ORAL_TABLET | Freq: Every day | ORAL | 1 refills | Status: DC
Start: 2023-09-04 — End: 2023-09-07

## 2023-09-04 MED ORDER — LUBIPROSTONE 24 MCG PO CAPS
24.0000 ug | ORAL_CAPSULE | Freq: Two times a day (BID) | ORAL | 1 refills | Status: DC
Start: 2023-09-04 — End: 2023-10-01

## 2023-09-04 NOTE — Progress Notes (Signed)
        Established patient visit  History, exam, impression, and plan:  1. Prediabetes (Primary) History of prediabetes currently treated with metformin XR 1000 mg daily in the morning.  Tolerating well without side effects.  Has been on this long-term and her last 3 hemoglobin A1c readings have been normal.  Plan to continue metformin XR 1000 mg daily and work on dietary modification, regular intentional exercise, and maintenance of a healthy weight.  2. Depression, recurrent (HCC) 3. Anxiety Currently taking Zoloft 25 mg daily and Wellbutrin 300 mg daily.  Tolerating well without side effects.  Feels that this medication helps to keep her mood stable and has no current concerns.  Denies SI/HI.  Continue Zoloft and Wellbutrin as prescribed.  4. Hypothyroidism due to Hashimoto's thyroiditis Was started on levothyroxine 25 mcg daily and has been taking this as instructed.  Tolerating well without side effects.  Has not noted any difference in her overall health.  Plan to recheck TSH today and adjust dosing if necessary. - TSH  5. Chronic constipation Endorses a history of some chronic constipation that has been difficult to manage.  Has tried a stool softener with laxative that she obtained over-the-counter but this has not been helpful.  She is currently taking a prenatal vitamin with iron which likely contributes to her symptoms.  Recommend increasing water intake.  Consider using MiraLAX 17 g daily to aid in bowel regularity.  With a history of chronic constipation, plan to trial a prescribed medicine.  Sending Amitiza 24 mcg twice daily with meals.   Procedures performed this visit: None.  Return in about 6 months (around 03/05/2024) for chronic disease follow up.  __________________________________ Thayer Ohm, DNP, APRN, FNP-BC Primary Care and Sports Medicine [facility] [place]

## 2023-09-05 LAB — TSH: TSH: 4.79 u[IU]/mL — ABNORMAL HIGH (ref 0.450–4.500)

## 2023-09-07 ENCOUNTER — Encounter: Payer: Self-pay | Admitting: Medical-Surgical

## 2023-09-07 MED ORDER — LEVOTHYROXINE SODIUM 50 MCG PO TABS: 25.0000 ug | ORAL_TABLET | Freq: Every day | ORAL | 1 refills | Status: AC

## 2023-09-07 NOTE — Addendum Note (Signed)
 Addended byChristen Butter on: 09/07/2023 05:05 PM   Modules accepted: Orders

## 2023-09-23 ENCOUNTER — Encounter: Payer: Self-pay | Admitting: Medical-Surgical

## 2023-09-23 DIAGNOSIS — R1084 Generalized abdominal pain: Secondary | ICD-10-CM

## 2023-09-29 ENCOUNTER — Other Ambulatory Visit: Payer: Self-pay | Admitting: Medical-Surgical

## 2023-09-29 ENCOUNTER — Ambulatory Visit

## 2023-09-29 DIAGNOSIS — R109 Unspecified abdominal pain: Secondary | ICD-10-CM | POA: Diagnosis not present

## 2023-09-29 DIAGNOSIS — R1084 Generalized abdominal pain: Secondary | ICD-10-CM | POA: Diagnosis not present

## 2023-10-01 ENCOUNTER — Encounter: Payer: Self-pay | Admitting: Medical-Surgical

## 2023-12-01 ENCOUNTER — Other Ambulatory Visit: Payer: Self-pay | Admitting: Medical Genetics

## 2023-12-07 ENCOUNTER — Other Ambulatory Visit (HOSPITAL_COMMUNITY)

## 2023-12-08 ENCOUNTER — Other Ambulatory Visit (HOSPITAL_COMMUNITY)
Admission: RE | Admit: 2023-12-08 | Discharge: 2023-12-08 | Disposition: A | Discharge status: Home / Self Care | Source: Ambulatory Visit | Attending: Medical-Surgical | Admitting: Medical-Surgical

## 2023-12-08 ENCOUNTER — Encounter: Payer: Self-pay | Admitting: Medical-Surgical

## 2023-12-08 ENCOUNTER — Ambulatory Visit (INDEPENDENT_AMBULATORY_CARE_PROVIDER_SITE_OTHER): Admitting: Medical-Surgical

## 2023-12-08 VITALS — BP 97/62 | HR 82 | Resp 20 | Ht 64.0 in | Wt 142.0 lb

## 2023-12-08 DIAGNOSIS — R7303 Prediabetes: Secondary | ICD-10-CM | POA: Diagnosis not present

## 2023-12-08 DIAGNOSIS — E063 Autoimmune thyroiditis: Secondary | ICD-10-CM

## 2023-12-08 DIAGNOSIS — E611 Iron deficiency: Secondary | ICD-10-CM | POA: Diagnosis not present

## 2023-12-08 DIAGNOSIS — Z0000 Encounter for general adult medical examination without abnormal findings: Secondary | ICD-10-CM

## 2023-12-08 DIAGNOSIS — Z124 Encounter for screening for malignant neoplasm of cervix: Secondary | ICD-10-CM | POA: Diagnosis not present
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
## 2023-12-08 NOTE — Patient Instructions (Signed)

## 2023-12-09 ENCOUNTER — Ambulatory Visit: Payer: Self-pay | Admitting: Medical-Surgical

## 2023-12-10 LAB — IRON,TIBC AND FERRITIN PANEL
Ferritin: 11 ng/mL — ABNORMAL LOW (ref 15–150)
Iron Saturation: 22 % (ref 15–55)
Iron: 68 ug/dL (ref 27–159)
Total Iron Binding Capacity: 306 ug/dL (ref 250–450)
UIBC: 238 ug/dL (ref 131–425)

## 2023-12-10 LAB — TSH+T4F+T3FREE+THYABS+TPO+VD25
Free T4: 1.2 ng/dL (ref 0.82–1.77)
T3, Free: 2.9 pg/mL (ref 2.0–4.4)
TSH: 4.03 u[IU]/mL (ref 0.450–4.500)
Thyroglobulin Antibody: 1 [IU]/mL — AB (ref 0.0–0.9)
Thyroperoxidase Ab SerPl-aCnc: 34 [IU]/mL (ref 0–34)
Vit D, 25-Hydroxy: 43 ng/mL (ref 30.0–100.0)

## 2023-12-10 LAB — CMP14+EGFR
ALT: 5 IU/L (ref 0–32)
AST: 13 IU/L (ref 0–40)
Albumin: 4.5 g/dL (ref 3.9–4.9)
Alkaline Phosphatase: 64 IU/L (ref 44–121)
BUN/Creatinine Ratio: 14 (ref 9–23)
BUN: 14 mg/dL (ref 6–20)
Bilirubin Total: 0.3 mg/dL (ref 0.0–1.2)
CO2: 20 mmol/L (ref 20–29)
Calcium: 9.1 mg/dL (ref 8.7–10.2)
Chloride: 106 mmol/L (ref 96–106)
Creatinine, Ser: 0.99 mg/dL (ref 0.57–1.00)
Globulin, Total: 2.4 g/dL (ref 1.5–4.5)
Glucose: 56 mg/dL — ABNORMAL LOW (ref 70–99)
Potassium: 4.2 mmol/L (ref 3.5–5.2)
Sodium: 139 mmol/L (ref 134–144)
Total Protein: 6.9 g/dL (ref 6.0–8.5)
eGFR: 78 mL/min/1.73 (ref >59)

## 2023-12-10 LAB — CBC
Hematocrit: 39.7 % (ref 34.0–46.6)
Hemoglobin: 13 g/dL (ref 11.1–15.9)
MCH: 29.3 pg (ref 26.6–33.0)
MCHC: 32.7 g/dL (ref 31.5–35.7)
MCV: 90 fL (ref 79–97)
Platelets: 217 x10E3/uL (ref 150–450)
RBC: 4.43 x10E6/uL (ref 3.77–5.28)
RDW: 13.1 % (ref 11.7–15.4)
WBC: 6.6 x10E3/uL (ref 3.4–10.8)

## 2023-12-10 LAB — HEMOGLOBIN A1C
Est. average glucose Bld gHb Est-mCnc: 103 mg/dL
Hgb A1c MFr Bld: 5.2 % (ref 4.8–5.6)

## 2023-12-11 ENCOUNTER — Other Ambulatory Visit (HOSPITAL_COMMUNITY)
Admission: RE | Admit: 2023-12-11 | Discharge: 2023-12-11 | Disposition: A | Discharge status: Home / Self Care | Payer: Self-pay | Source: Ambulatory Visit | Attending: Medical Genetics | Admitting: Medical Genetics

## 2023-12-11 LAB — CYTOLOGY - PAP
Adequacy: ABSENT
Comment: NEGATIVE
Diagnosis: NEGATIVE
High risk HPV: NEGATIVE

## 2023-12-18 ENCOUNTER — Ambulatory Visit

## 2023-12-18 DIAGNOSIS — E042 Nontoxic multinodular goiter: Secondary | ICD-10-CM

## 2023-12-18 DIAGNOSIS — R590 Localized enlarged lymph nodes: Secondary | ICD-10-CM | POA: Diagnosis not present

## 2023-12-18 DIAGNOSIS — E0789 Other specified disorders of thyroid: Secondary | ICD-10-CM | POA: Diagnosis not present

## 2023-12-21 LAB — GENECONNECT MOLECULAR SCREEN: Genetic Analysis Overall Interpretation: NEGATIVE

## 2023-12-28 ENCOUNTER — Ambulatory Visit: Payer: Self-pay | Admitting: Medical-Surgical

## 2024-01-14 ENCOUNTER — Encounter: Admitting: Obstetrics and Gynecology

## 2024-02-07 IMAGING — US US THYROID
1 series · 13 of 25 positions shown · non-contrast
Comparison: None Available.

CLINICAL DATA: Goiter, weight changes

EXAM:
THYROID ULTRASOUND
TECHNIQUE: Ultrasound examination of the thyroid gland and adjacent soft
tissues was performed.

[Series 1: us thyroid · 65 acquisitions, 13 frames shown]
[im 1/65]
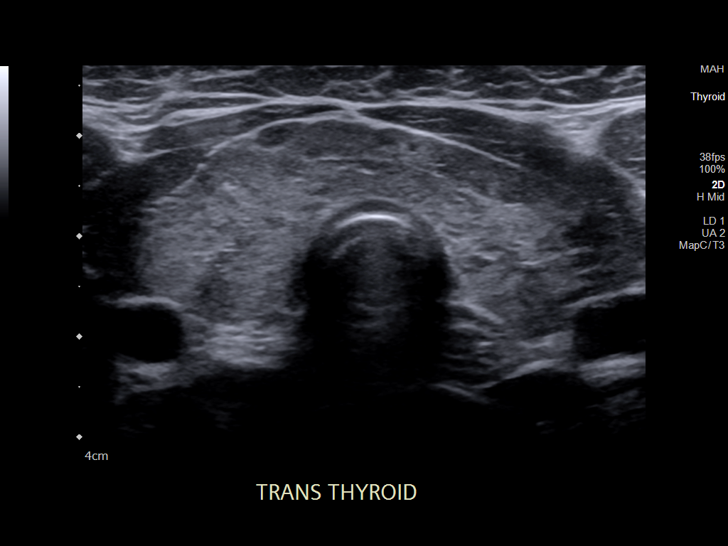
[im 6/65]
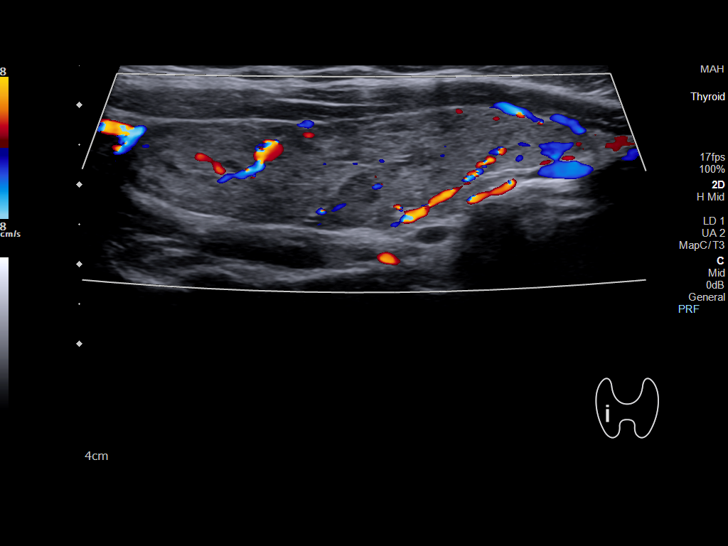
[im 11/65]
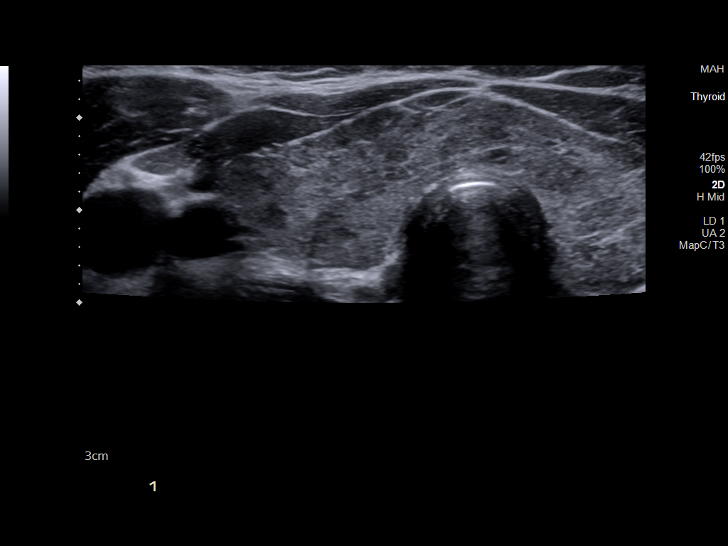
[im 17/65]
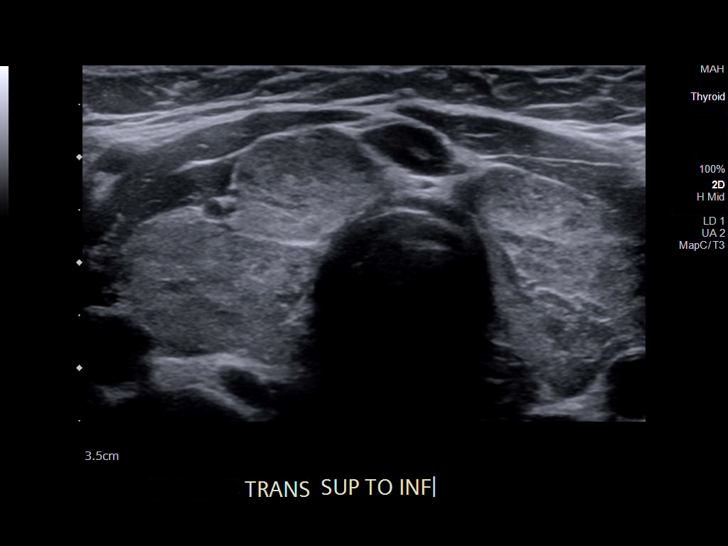
[im 22/65]
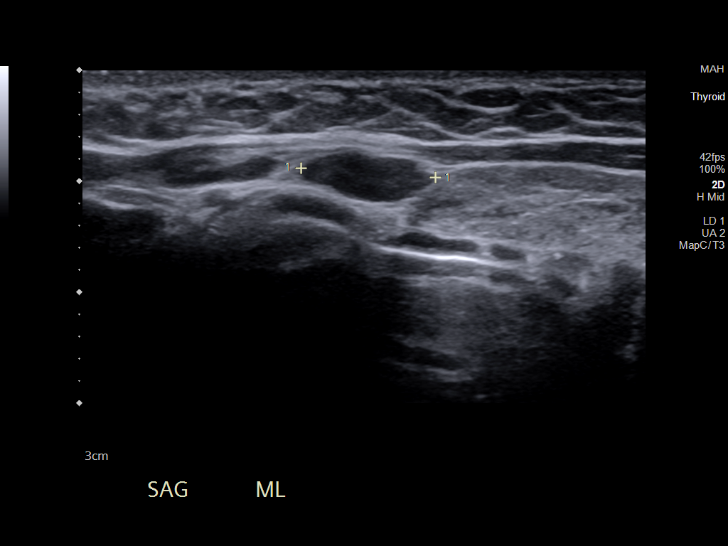
[im 27/65]
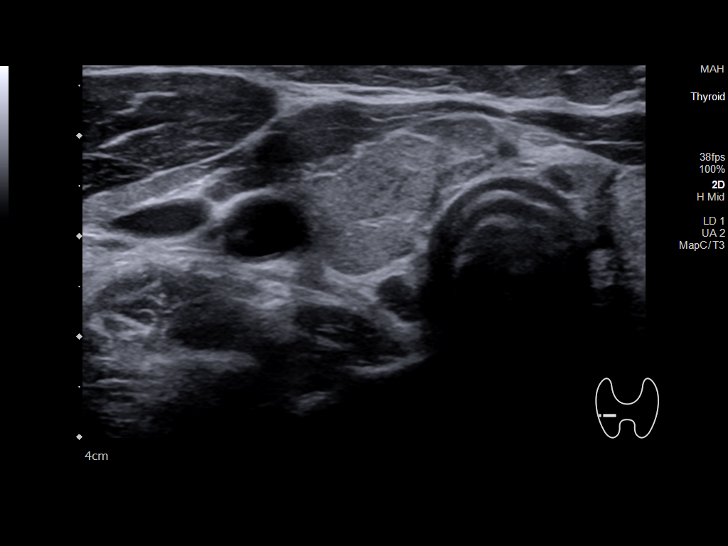
[im 33/65]
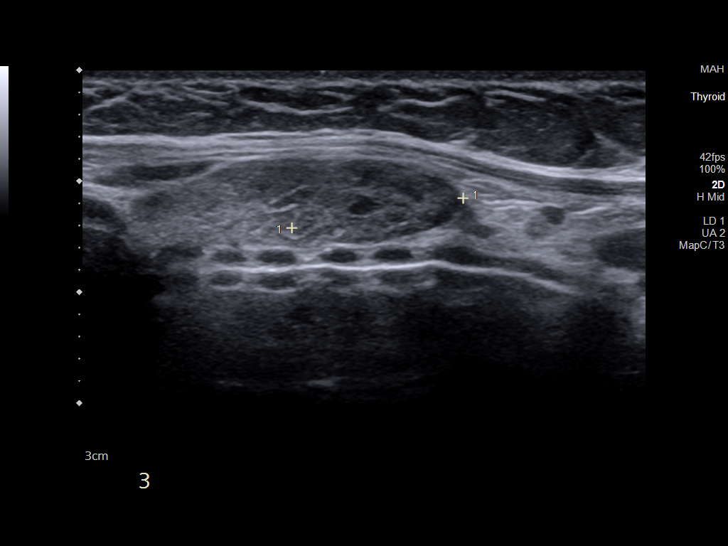
[im 38/65]
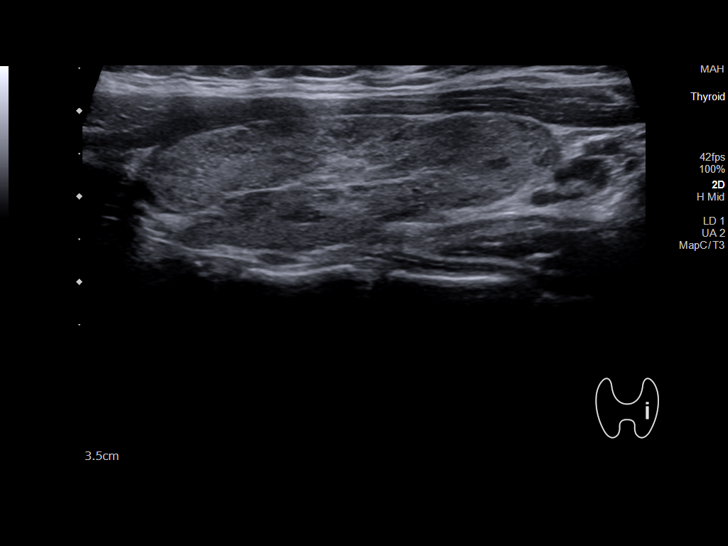
[im 43/65]
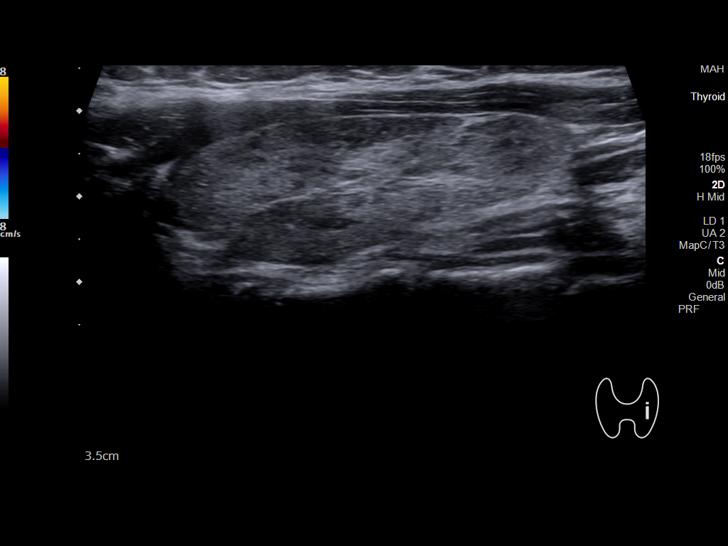
[im 49/65]
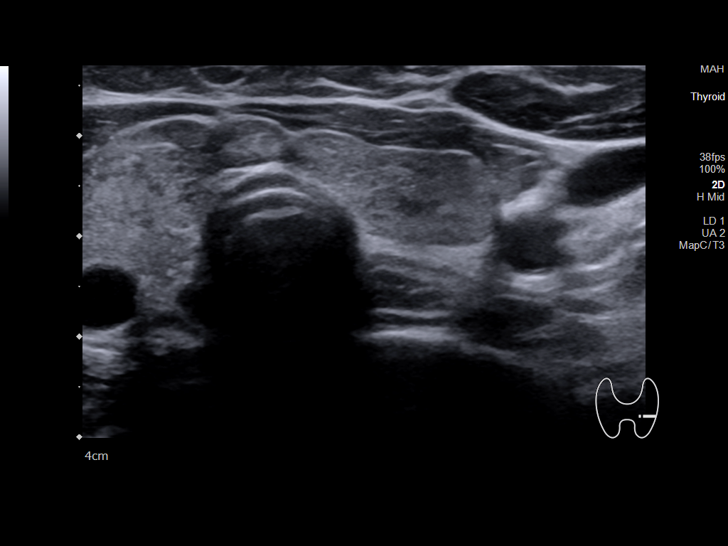
[im 54/65]
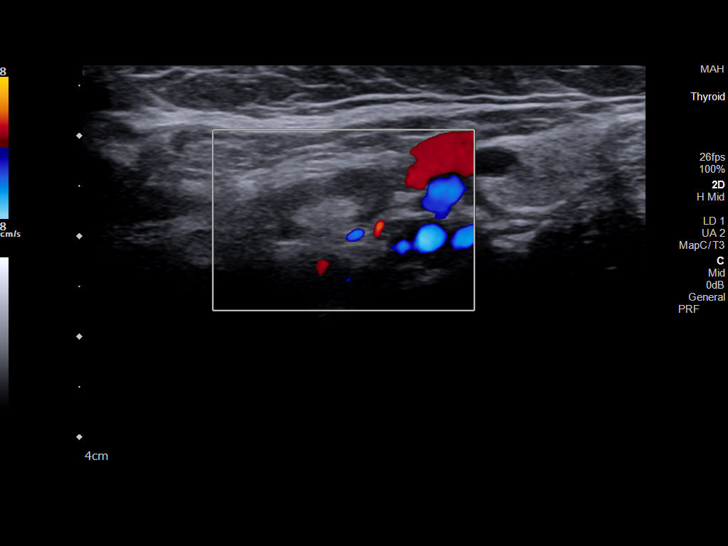
[im 59/65]
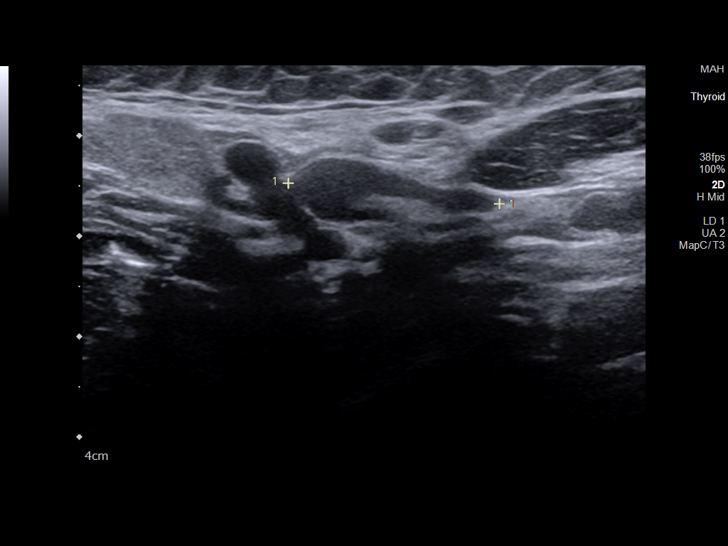
[im 65/65]
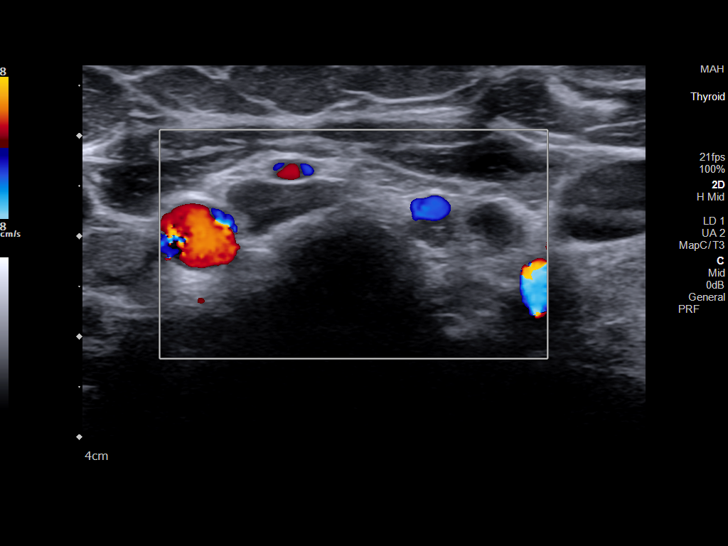

[13 of 25 positions shown; findings below may reference images not displayed]

FINDINGS: Parenchymal Echotexture: Markedly heterogenous

Isthmus: 8 mm

Right lobe: 6.0 x 1.6 x 2.3 cm

Left lobe: 5.0 x 1.7 x 2.0 cm

_________________________________________________________

Estimated total number of nodules >/= 1 cm: 3

Number of spongiform nodules >/=  2 cm not described below (TR1): 0

Number of mixed cystic and solid nodules >/= 1.5 cm not described
below (TR2): 0

_________________________________________________________

Nodule # 1:

Location: Right; Mid

Maximum size: 1.7 cm; Other 2 dimensions: 0.9 x 0.9 cm

Composition: solid/almost completely solid (2)

Echogenicity: isoechoic (1)

Shape: not taller-than-wide (0)

Margins: ill-defined (0)

Echogenic foci: none (0)

ACR TI-RADS total points: 3.

ACR TI-RADS risk category: TR3 (3 points).

ACR TI-RADS recommendations:

*Given size (>/= 1.5 - 2.4 cm) and appearance, a follow-up
ultrasound in 1 year should be considered based on TI-RADS criteria.

_________________________________________________________

Nodule # 2:

Location: Isthmus; right

Maximum size: 2.1 cm; Other 2 dimensions: 0.8 x 2.0 cm

Composition: solid/almost completely solid (2)

Echogenicity: isoechoic (1)

Shape: not taller-than-wide (0)

Margins: ill-defined (0)

Echogenic foci: none (0)

ACR TI-RADS total points: 3.

ACR TI-RADS risk category: TR3 (3 points).

ACR TI-RADS recommendations:

*Given size (>/= 1.5 - 2.4 cm) and appearance, a follow-up
ultrasound in 1 year should be considered based on TI-RADS criteria.

_________________________________________________________

Nodule # 3:

Location: Isthmus; Mid

Maximum size: 1.6 cm; Other 2 dimensions: 1.0 x 0.6 cm

Composition: solid/almost completely solid (2)

Echogenicity: isoechoic (1)

Shape: not taller-than-wide (0)

Margins: ill-defined (0)

Echogenic foci: none (0)

ACR TI-RADS total points: 3.

ACR TI-RADS risk category: TR3 (3 points).

ACR TI-RADS recommendations:

*Given size (>/= 1.5 - 2.4 cm) and appearance, a follow-up
ultrasound in 1 year should be considered based on TI-RADS criteria.

_________________________________________________________

Benign cervical lymph nodes also noted.
IMPRESSION: Right mid thyroid and isthmus TR 3 nodules versus pseudo nodules as
above. These meet criteria follow-up in 1 year.

Marked thyroid heterogeneity compatible with chronic medical thyroid
disease.

The above is in keeping with the ACR TI-RADS recommendations - [HOSPITAL] 3680;[DATE].

## 2024-03-07 ENCOUNTER — Ambulatory Visit: Admitting: Medical-Surgical

## 2024-03-10 ENCOUNTER — Other Ambulatory Visit: Payer: Self-pay | Admitting: Medical-Surgical

## 2024-12-08 ENCOUNTER — Encounter: Admitting: Medical-Surgical
